# Patient Record
Sex: Female | Born: 1985
Health system: Southern US, Community
[De-identification: ages and names within clinical notes are randomized; demographics above are authoritative.]

## PROBLEM LIST (undated history)

## (undated) DIAGNOSIS — I10 Essential (primary) hypertension: Secondary | ICD-10-CM

## (undated) DIAGNOSIS — T783XXA Angioneurotic edema, initial encounter: Secondary | ICD-10-CM

## (undated) HISTORY — PX: OTHER SURGICAL HISTORY: SHX169

## (undated) HISTORY — DX: Essential (primary) hypertension: I10

## (undated) HISTORY — PX: DILATION AND CURETTAGE OF UTERUS: SHX78

---

## 2012-07-21 DIAGNOSIS — T783XXA Angioneurotic edema, initial encounter: Secondary | ICD-10-CM | POA: Insufficient documentation

## 2018-03-11 HISTORY — PX: HYSTEROSCOPY: SHX211

## 2018-06-09 DIAGNOSIS — I10 Essential (primary) hypertension: Secondary | ICD-10-CM | POA: Insufficient documentation

## 2019-07-20 ENCOUNTER — Ambulatory Visit (HOSPITAL_COMMUNITY)
Admission: EM | Admit: 2019-07-20 | Discharge: 2019-07-20 | Disposition: A | Discharge status: Home / Self Care | Payer: 59 | Attending: Family Medicine | Admitting: Family Medicine

## 2019-07-20 ENCOUNTER — Encounter (HOSPITAL_COMMUNITY): Payer: Self-pay | Admitting: Emergency Medicine

## 2019-07-20 ENCOUNTER — Other Ambulatory Visit: Payer: Self-pay

## 2019-07-20 DIAGNOSIS — J029 Acute pharyngitis, unspecified: Secondary | ICD-10-CM | POA: Diagnosis not present

## 2019-07-20 DIAGNOSIS — R0602 Shortness of breath: Secondary | ICD-10-CM | POA: Insufficient documentation

## 2019-07-20 DIAGNOSIS — J069 Acute upper respiratory infection, unspecified: Secondary | ICD-10-CM

## 2019-07-20 DIAGNOSIS — Z20828 Contact with and (suspected) exposure to other viral communicable diseases: Secondary | ICD-10-CM | POA: Diagnosis not present

## 2019-07-20 DIAGNOSIS — Z1159 Encounter for screening for other viral diseases: Secondary | ICD-10-CM

## 2019-07-20 DIAGNOSIS — R630 Anorexia: Secondary | ICD-10-CM | POA: Diagnosis not present

## 2019-07-20 HISTORY — DX: Angioneurotic edema, initial encounter: T78.3XXA

## 2019-07-20 NOTE — ED Notes (Signed)
Nasal swab in lab 

## 2019-07-20 NOTE — ED Provider Notes (Signed)
[place]    CSN: [id number] Arrival date & time: 07/20/19  7829      History   Chief Complaint Chief Complaint  Patient presents with  . URI    HPI Melanie Hunt is a 33 y.o. female.   Melanie Hunt presents with complaints of nasal drainage, sore throat, cough. Started on 10/23. Vomited once a few days ago. Has now felt weak and fatigue for a few days now as well. Loss of appetite taste and smell. No further nausea. Mild shortness of breath  With activity. Cough is productive of phlegm which is yellow. No known fevers, but has felt warm. No rash. No ear pain. No known ill contacts. Works in a [employer]. Has taken alka seltzer, advil cold and sinus and mucinex today for symptoms. They seem to briefly help. Has been using afrin as well.  No history of asthma. No chest pain . Without contributing medical history.      ROS per HPI, negative if not otherwise mentioned.        Past Medical History:  Diagnosis Date  . Idiopathic angioedema     There are no active problems to display for this patient.   History reviewed. No pertinent surgical history.  OB History   No obstetric history on file.      Home Medications    Prior to Admission medications   Medication Sig Start Date End Date Taking? Authorizing Provider  guaiFENesin (MUCINEX) 600 MG 12 hr tablet Take by mouth 2 (two) times daily.   Yes [provider]  Phenyleph-CPM-DM-APAP (ALKA-SELTZER PLUS COLD & FLU PO) Take by mouth.   Yes [provider]  Pseudoephedrine-Ibuprofen (ADVIL COLD/SINUS PO) Take by mouth.   Yes [provider]    Family History Family History  Problem Relation Age of Onset  . Hypertension Mother     Social History Social History   Tobacco Use  . Smoking status: Never Smoker  Substance Use Topics  . Alcohol use: Yes  . Drug use: Never     Allergies   Patient has no known allergies.   Review of Systems Review of Systems    Physical Exam Triage Vital Signs ED Triage Vitals  Enc Vitals Group     BP 07/20/19 0914 (!) 145/101     Pulse Rate 07/20/19 0914 100     Resp 07/20/19 0914 18     Temp 07/20/19 0914 98.9 F (37.2 C)     Temp Source 07/20/19 0914 Oral     SpO2 07/20/19 0914 100 %     Weight --      Height --      Head Circumference --      Peak Flow --      Pain Score 07/20/19 0908 5     Pain Loc --      Pain Edu? --      Excl. in [place]? --    No data found.  Updated Vital Signs BP (!) 145/101 (BP Location: Right Arm) Comment (BP Location): large cuff  Pulse 100   Temp 98.9 F (37.2 C) (Oral)   Resp 18   SpO2 100%    Physical Exam Constitutional:      General: She is not in acute distress.    Appearance: She is well-developed.  HENT:     Nose: Congestion and rhinorrhea present.  Cardiovascular:     Rate and Rhythm: Normal rate.  Pulmonary:     Effort: Pulmonary effort is  normal.  Skin:    General: Skin is warm and dry.  Neurological:     Mental Status: She is alert and oriented to person, place, and time.      UC Treatments / Results  Labs (all labs ordered are listed, but only abnormal results are displayed) Labs Reviewed  NOVEL CORONAVIRUS, NAA (HOSP ORDER, SEND-OUT TO REF LAB; TAT 18-24 HRS)    EKG   Radiology No results found.  Procedures Procedures (including critical care time)  Medications Ordered in UC Medications - No data to display  Initial Impression / Assessment and Plan / UC Course  I have reviewed the triage vital signs and the nursing notes.  Pertinent labs & imaging results that were available during my care of the patient were reviewed by me and considered in my medical decision making (see chart for details).     Non toxic. Afebrile. No increased work of breathing. No hypoxia. History and physical consistent with viral illness.  covid testing pending. Isolation recommended. Supportive cares recommended. Return precautions provided. Patient  verbalized understanding and agreeable to plan.   Final Clinical Impressions(s) / UC Diagnoses   Final diagnoses:  Acute upper respiratory infection     Discharge Instructions     Push fluids to ensure adequate hydration and keep secretions thin.  Tylenol and/or ibuprofen as needed for pain or fevers.  Over the counter medications as needed for symptoms, as you have been doing. I would discontinue afrin at this point. You can use plain nasal saline to help promote drainage and moisturize the nose, as well as a daily flonase or nasacort.  Self isolate until covid results are back and negative.  Will notify you of any positive findings. You may monitor your results on your MyChart online as well.    If symptoms worsen or do not improve in the next week to return to be seen or to follow up with your PCP.      ED Prescriptions    None     PDMP not reviewed this encounter.   Georgetta Haber, NP 07/20/19 [phone]

## 2019-07-20 NOTE — Discharge Instructions (Signed)
Push fluids to ensure adequate hydration and keep secretions thin.  Tylenol and/or ibuprofen as needed for pain or fevers.  Over the counter medications as needed for symptoms, as you have been doing. I would discontinue afrin at this point. You can use plain nasal saline to help promote drainage and moisturize the nose, as well as a daily flonase or nasacort.  Self isolate until covid results are back and negative.  Will notify you of any positive findings. You may monitor your results on your MyChart online as well.    If symptoms worsen or do not improve in the next week to return to be seen or to follow up with your PCP.

## 2019-07-20 NOTE — ED Triage Notes (Signed)
Started feeling bad on Friday 07/15/2019 Patient has had sore throat , cough, congestion, runny nose, fatigue and weakness, and lost of appetite-taste and smell

## 2019-07-23 LAB — NOVEL CORONAVIRUS, NAA (HOSP ORDER, SEND-OUT TO REF LAB; TAT 18-24 HRS): SARS-CoV-2, NAA: NOT DETECTED

## 2019-09-06 ENCOUNTER — Ambulatory Visit (HOSPITAL_COMMUNITY)
Admission: EM | Admit: 2019-09-06 | Discharge: 2019-09-06 | Disposition: A | Discharge status: Home / Self Care | Payer: 59

## 2019-09-06 ENCOUNTER — Other Ambulatory Visit: Payer: Self-pay

## 2019-09-06 NOTE — ED Notes (Signed)
Patient LWBS prior to registration for care today.

## 2019-11-04 ENCOUNTER — Encounter (HOSPITAL_COMMUNITY): Payer: Self-pay | Admitting: Internal Medicine

## 2019-11-04 ENCOUNTER — Encounter (HOSPITAL_COMMUNITY): Payer: Self-pay

## 2019-11-04 ENCOUNTER — Other Ambulatory Visit: Payer: Self-pay

## 2019-11-04 ENCOUNTER — Emergency Department (HOSPITAL_COMMUNITY): Payer: HRSA Program

## 2019-11-04 ENCOUNTER — Ambulatory Visit (INDEPENDENT_AMBULATORY_CARE_PROVIDER_SITE_OTHER): Payer: HRSA Program

## 2019-11-04 ENCOUNTER — Ambulatory Visit (HOSPITAL_COMMUNITY)
Admission: EM | Admit: 2019-11-04 | Discharge: 2019-11-04 | Payer: HRSA Program | Attending: Family Medicine | Admitting: Family Medicine

## 2019-11-04 ENCOUNTER — Inpatient Hospital Stay (HOSPITAL_COMMUNITY)
Admission: EM | Admit: 2019-11-04 | Discharge: 2019-11-09 | DRG: 177 | Disposition: A | Discharge status: Home / Self Care | Payer: HRSA Program | Attending: Internal Medicine | Admitting: Internal Medicine

## 2019-11-04 DIAGNOSIS — J189 Pneumonia, unspecified organism: Secondary | ICD-10-CM

## 2019-11-04 DIAGNOSIS — Z6836 Body mass index (BMI) 36.0-36.9, adult: Secondary | ICD-10-CM | POA: Diagnosis not present

## 2019-11-04 DIAGNOSIS — D649 Anemia, unspecified: Secondary | ICD-10-CM

## 2019-11-04 DIAGNOSIS — U071 COVID-19: Secondary | ICD-10-CM

## 2019-11-04 DIAGNOSIS — N921 Excessive and frequent menstruation with irregular cycle: Secondary | ICD-10-CM | POA: Diagnosis present

## 2019-11-04 DIAGNOSIS — J1282 Pneumonia due to coronavirus disease 2019: Secondary | ICD-10-CM | POA: Insufficient documentation

## 2019-11-04 DIAGNOSIS — J9601 Acute respiratory failure with hypoxia: Secondary | ICD-10-CM

## 2019-11-04 DIAGNOSIS — E876 Hypokalemia: Secondary | ICD-10-CM | POA: Diagnosis present

## 2019-11-04 DIAGNOSIS — Z8249 Family history of ischemic heart disease and other diseases of the circulatory system: Secondary | ICD-10-CM

## 2019-11-04 DIAGNOSIS — E669 Obesity, unspecified: Secondary | ICD-10-CM | POA: Diagnosis not present

## 2019-11-04 DIAGNOSIS — R0602 Shortness of breath: Secondary | ICD-10-CM | POA: Diagnosis present

## 2019-11-04 DIAGNOSIS — R05 Cough: Secondary | ICD-10-CM | POA: Diagnosis present

## 2019-11-04 HISTORY — DX: Acute respiratory failure with hypoxia: J96.01

## 2019-11-04 HISTORY — DX: COVID-19: U07.1

## 2019-11-04 LAB — TRIGLYCERIDES: Triglycerides: 160 mg/dL — ABNORMAL HIGH (ref <150)

## 2019-11-04 LAB — RESPIRATORY PANEL BY RT PCR (FLU A&B, COVID)
Influenza A by PCR: NEGATIVE
Influenza B by PCR: NEGATIVE
SARS Coronavirus 2 by RT PCR: POSITIVE — AB

## 2019-11-04 LAB — CBC WITH DIFFERENTIAL/PLATELET
Abs Immature Granulocytes: 0.01 10*3/uL (ref 0.00–0.07)
Abs Immature Granulocytes: 0.01 10*3/uL (ref 0.00–0.07)
Basophils Absolute: 0 10*3/uL (ref 0.0–0.1)
Basophils Absolute: 0 10*3/uL (ref 0.0–0.1)
Basophils Relative: 0 %
Basophils Relative: 0 %
Eosinophils Absolute: 0 10*3/uL (ref 0.0–0.5)
Eosinophils Absolute: 0 10*3/uL (ref 0.0–0.5)
Eosinophils Relative: 0 %
Eosinophils Relative: 0 %
HCT: 36.3 % (ref 36.0–46.0)
HCT: 36.7 % (ref 36.0–46.0)
Hemoglobin: 12 g/dL (ref 12.0–15.0)
Hemoglobin: 12 g/dL (ref 12.0–15.0)
Immature Granulocytes: 0 %
Immature Granulocytes: 0 %
Lymphocytes Relative: 21 %
Lymphocytes Relative: 25 %
Lymphs Abs: 1 10*3/uL (ref 0.7–4.0)
Lymphs Abs: 1.1 10*3/uL (ref 0.7–4.0)
MCH: 28.2 pg (ref 26.0–34.0)
MCH: 27.8 pg (ref 26.0–34.0)
MCHC: 33.1 g/dL (ref 30.0–36.0)
MCHC: 32.7 g/dL (ref 30.0–36.0)
MCV: 85.4 fL (ref 80.0–100.0)
MCV: 85.2 fL (ref 80.0–100.0)
Monocytes Absolute: 0.1 10*3/uL (ref 0.1–1.0)
Monocytes Absolute: 0.1 10*3/uL (ref 0.1–1.0)
Monocytes Relative: 2 %
Monocytes Relative: 2 %
Neutro Abs: 3.5 10*3/uL (ref 1.7–7.7)
Neutro Abs: 3.1 10*3/uL (ref 1.7–7.7)
Neutrophils Relative %: 77 %
Neutrophils Relative %: 73 %
Platelets: 199 10*3/uL (ref 150–400)
Platelets: 200 10*3/uL (ref 150–400)
RBC: 4.25 MIL/uL (ref 3.87–5.11)
RBC: 4.31 MIL/uL (ref 3.87–5.11)
RDW: 13.3 % (ref 11.5–15.5)
RDW: 13.3 % (ref 11.5–15.5)
WBC: 4.6 10*3/uL (ref 4.0–10.5)
WBC: 4.3 10*3/uL (ref 4.0–10.5)
nRBC: 0 % (ref 0.0–0.2)
nRBC: 0 % (ref 0.0–0.2)

## 2019-11-04 LAB — COMPREHENSIVE METABOLIC PANEL
ALT: 31 U/L (ref 0–44)
ALT: 31 U/L (ref 0–44)
AST: 40 U/L (ref 15–41)
AST: 41 U/L (ref 15–41)
Albumin: 3.3 g/dL — ABNORMAL LOW (ref 3.5–5.0)
Albumin: 3.3 g/dL — ABNORMAL LOW (ref 3.5–5.0)
Alkaline Phosphatase: 79 U/L (ref 38–126)
Alkaline Phosphatase: 76 U/L (ref 38–126)
Anion gap: 12 (ref 5–15)
Anion gap: 13 (ref 5–15)
BUN: <5 mg/dL — ABNORMAL LOW (ref 6–20)
BUN: 5 mg/dL — ABNORMAL LOW (ref 6–20)
CO2: 26 mmol/L (ref 22–32)
CO2: 27 mmol/L (ref 22–32)
Calcium: 7.9 mg/dL — ABNORMAL LOW (ref 8.9–10.3)
Calcium: 7.8 mg/dL — ABNORMAL LOW (ref 8.9–10.3)
Chloride: 102 mmol/L (ref 98–111)
Chloride: 100 mmol/L (ref 98–111)
Creatinine, Ser: 1.05 mg/dL — ABNORMAL HIGH (ref 0.44–1.00)
Creatinine, Ser: 1.07 mg/dL — ABNORMAL HIGH (ref 0.44–1.00)
GFR calc Af Amer: >60 mL/min (ref >60)
GFR calc Af Amer: >60 mL/min (ref >60)
GFR calc non Af Amer: >60 mL/min (ref >60)
GFR calc non Af Amer: >60 mL/min (ref >60)
Glucose, Bld: 108 mg/dL — ABNORMAL HIGH (ref 70–99)
Glucose, Bld: 101 mg/dL — ABNORMAL HIGH (ref 70–99)
Potassium: 3.1 mmol/L — ABNORMAL LOW (ref 3.5–5.1)
Potassium: 3.1 mmol/L — ABNORMAL LOW (ref 3.5–5.1)
Sodium: 140 mmol/L (ref 135–145)
Sodium: 140 mmol/L (ref 135–145)
Total Bilirubin: 0.7 mg/dL (ref 0.3–1.2)
Total Bilirubin: 0.8 mg/dL (ref 0.3–1.2)
Total Protein: 6.8 g/dL (ref 6.5–8.1)
Total Protein: 6.8 g/dL (ref 6.5–8.1)

## 2019-11-04 LAB — POC SARS CORONAVIRUS 2 AG: SARS Coronavirus 2 Ag: NEGATIVE

## 2019-11-04 LAB — SARS CORONAVIRUS 2 (TAT 6-24 HRS): SARS Coronavirus 2: POSITIVE — AB

## 2019-11-04 LAB — D-DIMER, QUANTITATIVE: D-Dimer, Quant: 0.64 ug/mL-FEU — ABNORMAL HIGH (ref 0.00–0.50)

## 2019-11-04 LAB — FERRITIN: Ferritin: 404 ng/mL — ABNORMAL HIGH (ref 11–307)

## 2019-11-04 LAB — I-STAT BETA HCG BLOOD, ED (MC, WL, AP ONLY): I-stat hCG, quantitative: <5 m[IU]/mL (ref <5)

## 2019-11-04 LAB — C-REACTIVE PROTEIN: CRP: 15.9 mg/dL — ABNORMAL HIGH (ref <1.0)

## 2019-11-04 LAB — LACTATE DEHYDROGENASE: LDH: 395 U/L — ABNORMAL HIGH (ref 98–192)

## 2019-11-04 LAB — LACTIC ACID, PLASMA
Lactic Acid, Venous: 0.9 mmol/L (ref 0.5–1.9)
Lactic Acid, Venous: 1.1 mmol/L (ref 0.5–1.9)

## 2019-11-04 LAB — FIBRINOGEN: Fibrinogen: 571 mg/dL — ABNORMAL HIGH (ref 210–475)

## 2019-11-04 LAB — PROCALCITONIN: Procalcitonin: 0.62 ng/mL

## 2019-11-04 LAB — HCG, QUANTITATIVE, PREGNANCY: hCG, Beta Chain, Quant, S: <1 m[IU]/mL (ref <5)

## 2019-11-04 LAB — ABO/RH: ABO/RH(D): O POS

## 2019-11-04 LAB — POC SARS CORONAVIRUS 2 AG -  ED: SARS Coronavirus 2 Ag: NEGATIVE

## 2019-11-04 MED ORDER — ASCORBIC ACID 500 MG PO TABS
500.0000 mg | ORAL_TABLET | Freq: Every day | ORAL | Status: DC
  Administered 2019-11-04 – 2019-11-09 (×6): 500 mg via ORAL
  Filled 2019-11-04 (×6): qty 1

## 2019-11-04 MED ORDER — DEXAMETHASONE 6 MG PO TABS
6.0000 mg | ORAL_TABLET | ORAL | Status: DC
  Administered 2019-11-04 – 2019-11-09 (×5): 6 mg via ORAL
  Filled 2019-11-04 (×5): qty 1

## 2019-11-04 MED ORDER — ACETAMINOPHEN 500 MG PO TABS
1000.0000 mg | ORAL_TABLET | Freq: Once | ORAL | Status: AC
  Administered 2019-11-04: 1000 mg via ORAL
  Filled 2019-11-04: qty 2

## 2019-11-04 MED ORDER — ENOXAPARIN SODIUM 40 MG/0.4ML ~~LOC~~ SOLN
40.0000 mg | SUBCUTANEOUS | Status: DC
  Administered 2019-11-04: 16:00:00 40 mg via SUBCUTANEOUS
  Filled 2019-11-04: qty 0.4

## 2019-11-04 MED ORDER — GUAIFENESIN ER 600 MG PO TB12
1200.0000 mg | ORAL_TABLET | Freq: Two times a day (BID) | ORAL | Status: DC
  Administered 2019-11-04 – 2019-11-09 (×11): 1200 mg via ORAL
  Filled 2019-11-04 (×11): qty 2

## 2019-11-04 MED ORDER — ACETAMINOPHEN 325 MG PO TABS
975.0000 mg | ORAL_TABLET | Freq: Once | ORAL | Status: AC
  Administered 2019-11-04: 975 mg via ORAL

## 2019-11-04 MED ORDER — SODIUM CHLORIDE 0.9 % IV SOLN
100.0000 mg | Freq: Every day | INTRAVENOUS | Status: AC
  Administered 2019-11-05 – 2019-11-08 (×4): 100 mg via INTRAVENOUS
  Filled 2019-11-04 (×4): qty 20

## 2019-11-04 MED ORDER — POTASSIUM CHLORIDE CRYS ER 20 MEQ PO TBCR
40.0000 meq | EXTENDED_RELEASE_TABLET | Freq: Once | ORAL | Status: AC
  Administered 2019-11-04: 16:00:00 40 meq via ORAL
  Filled 2019-11-04: qty 2

## 2019-11-04 MED ORDER — SODIUM CHLORIDE 0.9 % IV SOLN
200.0000 mg | Freq: Once | INTRAVENOUS | Status: AC
  Administered 2019-11-04: 16:00:00 200 mg via INTRAVENOUS
  Filled 2019-11-04: qty 40

## 2019-11-04 MED ORDER — TOCILIZUMAB 400 MG/20ML IV SOLN
800.0000 mg | Freq: Once | INTRAVENOUS | Status: AC
  Administered 2019-11-04: 800 mg via INTRAVENOUS
  Filled 2019-11-04: qty 40

## 2019-11-04 MED ORDER — ENOXAPARIN SODIUM 60 MG/0.6ML ~~LOC~~ SOLN
50.0000 mg | SUBCUTANEOUS | Status: DC
  Administered 2019-11-05 – 2019-11-08 (×4): 50 mg via SUBCUTANEOUS
  Filled 2019-11-04 (×4): qty 0.6

## 2019-11-04 MED ORDER — AZITHROMYCIN 250 MG PO TABS
500.0000 mg | ORAL_TABLET | Freq: Once | ORAL | Status: AC
  Administered 2019-11-04: 12:00:00 500 mg via ORAL
  Filled 2019-11-04: qty 2

## 2019-11-04 MED ORDER — ZINC SULFATE 220 (50 ZN) MG PO CAPS
220.0000 mg | ORAL_CAPSULE | Freq: Every day | ORAL | Status: DC
  Administered 2019-11-04 – 2019-11-09 (×6): 220 mg via ORAL
  Filled 2019-11-04 (×6): qty 1

## 2019-11-04 MED ORDER — SENNOSIDES-DOCUSATE SODIUM 8.6-50 MG PO TABS
1.0000 | ORAL_TABLET | Freq: Every evening | ORAL | Status: DC | PRN
  Filled 2019-11-04: qty 1

## 2019-11-04 MED ORDER — DEXAMETHASONE SODIUM PHOSPHATE 10 MG/ML IJ SOLN
10.0000 mg | Freq: Once | INTRAMUSCULAR | Status: AC
  Administered 2019-11-04: 15:00:00 10 mg via INTRAVENOUS
  Filled 2019-11-04: qty 1

## 2019-11-04 MED ORDER — INFLUENZA VAC SPLIT QUAD 0.5 ML IM SUSY
0.5000 mL | PREFILLED_SYRINGE | INTRAMUSCULAR | Status: DC
  Filled 2019-11-04: qty 0.5

## 2019-11-04 MED ORDER — ALBUTEROL SULFATE HFA 108 (90 BASE) MCG/ACT IN AERS
2.0000 | INHALATION_SPRAY | Freq: Once | RESPIRATORY_TRACT | Status: AC
  Administered 2019-11-04: 15:00:00 2 via RESPIRATORY_TRACT
  Filled 2019-11-04: qty 6.7

## 2019-11-04 MED ORDER — ALBUTEROL SULFATE HFA 108 (90 BASE) MCG/ACT IN AERS
2.0000 | INHALATION_SPRAY | Freq: Four times a day (QID) | RESPIRATORY_TRACT | Status: DC
  Administered 2019-11-04 – 2019-11-09 (×19): 2 via RESPIRATORY_TRACT
  Filled 2019-11-04: qty 6.7

## 2019-11-04 MED ORDER — ENOXAPARIN SODIUM 60 MG/0.6ML ~~LOC~~ SOLN: 50.0000 mg | SUBCUTANEOUS | Status: DC

## 2019-11-04 MED ORDER — POTASSIUM CHLORIDE CRYS ER 20 MEQ PO TBCR
40.0000 meq | EXTENDED_RELEASE_TABLET | Freq: Every day | ORAL | Status: DC
  Administered 2019-11-04: 19:00:00 40 meq via ORAL
  Filled 2019-11-04: qty 2

## 2019-11-04 MED ORDER — ACETAMINOPHEN 325 MG PO TABS
ORAL_TABLET | ORAL | Status: AC
  Filled 2019-11-04: qty 3

## 2019-11-04 MED ORDER — ACETAMINOPHEN 500 MG PO TABS: 1000.0000 mg | ORAL_TABLET | Freq: Four times a day (QID) | ORAL | Status: DC | PRN

## 2019-11-04 MED ORDER — ALBUMIN HUMAN 25 % IV SOLN
12.5000 g | Freq: Four times a day (QID) | INTRAVENOUS | Status: DC
  Filled 2019-11-04 (×3): qty 50

## 2019-11-04 MED ORDER — SODIUM CHLORIDE 0.9 % IV SOLN
1.0000 g | Freq: Once | INTRAVENOUS | Status: AC
  Administered 2019-11-04: 12:00:00 1 g via INTRAVENOUS
  Filled 2019-11-04: qty 10

## 2019-11-04 NOTE — ED Notes (Signed)
[facility] notified of transport.  

## 2019-11-04 NOTE — ED Triage Notes (Signed)
Pt is here with SOB that started Wednesday & her cough developed 10/28/2019. She has taken alka seltzer,mucinex  and Advil to relieve discomfort.

## 2019-11-04 NOTE — ED Notes (Signed)
[facility] is here to transport pt to [facility].

## 2019-11-04 NOTE — H&P (Signed)
History and Physical    Melanie Hunt PZW:[id number] DOB: 01/14/1986 DOA: 11/04/2019  PCP: Patient, No Pcp Per    Patient coming from: Home  I have personally briefly reviewed patient's old medical records in [place]  Chief Complaint: SOB  HPI: Melanie Hunt is a 34 y.o. female with no significant past medical history presented with increasing cough and short of breath for one week. Along with subjective fever and whole body aching and fatigue.  Symptoms started worsening on two days ago with increasing shortness of breath.  Coughing has been dry, and Advil and Tylenol alternative did little for her symptoms.  No sore throat, wheezing or chest pain.  No known COVID contact.  ED Course: O2 saturation 87% on room air, improved to 95% on 2 L.  Covid test came back positive  Review of Systems: As per HPI otherwise 10 point review of systems negative.    Past Medical History:  Diagnosis Date  . Idiopathic angioedema     No past surgical history on file.   reports that she has never smoked. She has never used smokeless tobacco. She reports current alcohol use. She reports that she does not use drugs.  Allergies  Allergen Reactions  . No Known Allergies     Family History  Problem Relation Age of Onset  . Hypertension Mother      Prior to Admission medications   Medication Sig Start Date End Date Taking? Authorizing Provider  acetaminophen (TYLENOL) 500 MG tablet Take 1,000 mg by mouth every 6 (six) hours as needed for mild pain.   Yes [provider]  guaiFENesin (MUCINEX) 600 MG 12 hr tablet Take by mouth 2 (two) times daily.   Yes [provider]  Phenyleph-CPM-DM-APAP (ALKA-SELTZER PLUS COLD & FLU PO) Take by mouth.   Yes [provider]    Physical Exam: Vitals:   11/04/19 1330 11/04/19 1345 11/04/19 1400 11/04/19 1415  BP: 138/87 (!) 128/105 132/84 129/73  Pulse: 99 96 96 93  Resp: (!) 29 (!) 35 (!) 33 (!) 27  Temp:      TempSrc:       SpO2: 97% 96% 95% 97%    Constitutional: NAD, calm, comfortable Vitals:   11/04/19 1330 11/04/19 1345 11/04/19 1400 11/04/19 1415  BP: 138/87 (!) 128/105 132/84 129/73  Pulse: 99 96 96 93  Resp: (!) 29 (!) 35 (!) 33 (!) 27  Temp:      TempSrc:      SpO2: 97% 96% 95% 97%   Eyes: PERRL, lids and conjunctivae normal ENMT: Mucous membranes are dry. Posterior pharynx clear of any exudate or lesions.Normal dentition.  Neck: normal, supple, no masses, no thyromegaly Respiratory: Diminished breathing sound, tachypneic, no crackles with wheezing. Normal respiratory effort. No accessory muscle use.  Cardiovascular: Regular rate and rhythm, no murmurs / rubs / gallops. No extremity edema. 2+ pedal pulses. No carotid bruits.  Abdomen: no tenderness, no masses palpated. No hepatosplenomegaly. Bowel sounds positive.  Musculoskeletal: no clubbing / cyanosis. No joint deformity upper and lower extremities. Good ROM, no contractures. Normal muscle tone.  Skin: no rashes, lesions, ulcers. No induration Neurologic: CN 2-12 grossly intact. Sensation intact, DTR normal. Strength 5/5 in all 4.  Psychiatric: Normal judgment and insight. Alert and oriented x 3. Normal mood.     Labs on Admission: I have personally reviewed following labs and imaging studies  CBC: Recent Labs  Lab 11/04/19 1038 11/04/19 1056  WBC 4.6 4.3  NEUTROABS 3.5  3.1  HGB 12.0 12.0  HCT 36.3 36.7  MCV 85.4 85.2  PLT 199 200   Basic Metabolic Panel: Recent Labs  Lab 11/04/19 1038 11/04/19 1056  NA 140 140  K 3.1* 3.1*  CL 102 100  CO2 26 27  GLUCOSE 108* 101*  BUN <5* 5*  CREATININE 1.05* 1.07*  CALCIUM 7.9* 7.8*   GFR: CrCl cannot be calculated (Unknown ideal weight.). Liver Function Tests: Recent Labs  Lab 11/04/19 1038 11/04/19 1056  AST 40 41  ALT 31 31  ALKPHOS 79 76  BILITOT 0.7 0.8  PROT 6.8 6.8  ALBUMIN 3.3* 3.3*   No results for input(s): LIPASE, AMYLASE in the last 168 hours. No results  for input(s): AMMONIA in the last 168 hours. Coagulation Profile: No results for input(s): INR, PROTIME in the last 168 hours. Cardiac Enzymes: No results for input(s): CKTOTAL, CKMB, CKMBINDEX, TROPONINI in the last 168 hours. BNP (last 3 results) No results for input(s): PROBNP in the last 8760 hours. HbA1C: No results for input(s): HGBA1C in the last 72 hours. CBG: No results for input(s): GLUCAP in the last 168 hours. Lipid Profile: Recent Labs    11/04/19 1056  TRIG 160*   Thyroid Function Tests: No results for input(s): TSH, T4TOTAL, FREET4, T3FREE, THYROIDAB in the last 72 hours. Anemia Panel: Recent Labs    11/04/19 1056  FERRITIN 404*   Urine analysis: No results found for: COLORURINE, APPEARANCEUR, LABSPEC, PHURINE, GLUCOSEU, HGBUR, BILIRUBINUR, KETONESUR, PROTEINUR, UROBILINOGEN, NITRITE, LEUKOCYTESUR  Radiological Exams on Admission: DG Chest 2 View  Result Date: 11/04/2019 CLINICAL DATA:  Cough and shortness of breath with hypoxia EXAM: CHEST - 2 VIEW COMPARISON:  None. FINDINGS: Cardiac shadow is at the upper limits of normal in size. Bilateral opacities are identified consistent with multifocal pneumonia. No sizable effusion is noted. No bony abnormality is seen. IMPRESSION: Multifocal pneumonia.  Correlate with COVID-19 test Electronically Signed   By: Alcide Clever M.D.   On: 11/04/2019 09:44   DG Chest Portable 1 View  Result Date: 11/04/2019 CLINICAL DATA:  Cough and shortness of breath.  Fever. EXAM: PORTABLE CHEST 1 VIEW COMPARISON:  November 04, 2019 study obtained earlier in the day FINDINGS: There is patchy airspace opacity bilaterally with small left pleural effusion. Heart is upper normal in size with pulmonary vascularity normal. No adenopathy. No bone lesions. IMPRESSION: Multifocal pneumonia with small left pleural effusion, stable. No new opacity compared to earlier in the day. Stable cardiac silhouette. No adenopathy evident. Electronically Signed    By: Bretta Bang III M.D.   On: 11/04/2019 11:04    EKG: Independently reviewed.  Sinus tachycardia  Assessment/Plan Active Problems:   COVID-19 virus infection   COVID-19  Acute hypoxic respite failure secondary to COVID-19 pneumonia Remdesivir and steroid Albumin infusion x3 days Admitted to [facility], expect to be discharged in 1 to 2 days once oxygenation level improved. Breathing meds and other supportive medications..  Hypokalemia Replace and recheck  Morbid obesity Diet and calorie control   DVT prophylaxis: Lovenox Code Status: Full code Family Communication: None at bedside Disposition Plan: Hypoxia improved patient can be discharged likely in 1 to 2 days and complete remdesivir infusion outpatient infusion center. Consults called: None Admission status: Admitted to [facility] telemetry   Emeline General MD [facility] Pager [phone]    11/04/2019, 2:53 PM

## 2019-11-04 NOTE — Progress Notes (Addendum)
Patient seen and examined and reviewed HPI per Dr. Chipper Herb earlier from this afternoon   34 year old homosexual female known history metromenorrhagia failure of Depo-Provera followed at [facility], prior angioedema, obesity Admitted from [facility] emergency room with coronavirus symptoms  Potassium 3.1 BUN/creatinine stable LFTs normal CRP elevated 15.9 procalcitonin 0.6 lactic acid 1.1CXR shows multifocal pneumonia  Given somnolent appearance, fever 103 risk benefit discussion e: Actemra discussed no underlying conditions seem to preclude its use  Will re-val in am and update family  Pleas Koch, MD [facility] 5:27 PM

## 2019-11-04 NOTE — ED Notes (Signed)
Patient is being discharged from the Urgent Care Center and sent to the Emergency Department via wheelchair by staff. Per provider Dahlia Byes, patient is stable but in need of higher level of care due to unstable vitals & pneumonia diagnosis. Patient is aware and verbalizes understanding of plan of care.   Vitals:   11/04/19 0922 11/04/19 0936  BP:  (!) 142/82  Pulse:  (!) 116  Resp:  (!) 27  Temp:  (!) 102.9 F (39.4 C)  SpO2: (!) 85% 90%

## 2019-11-04 NOTE — ED Provider Notes (Signed)
[place]    CSN: [id number] Arrival date & time: 11/04/19  0626      History   Chief Complaint Chief Complaint  Patient presents with  . Shortness of Breath  . Cough    HPI Melanie Hunt is a 34 y.o. female.   Patient is a 34 year old female that  presents today with approximately 1 week of worsening productive cough, shortness of breath, fever.  Symptoms started worsening on Wednesday with more shortness of breath.  She has been taking Advil for her symptoms.  She has had some fatigue.  No known medical issues that she knows of.  No sore throat, ear pain.  No recent sick contacts or recent traveling.  No history of DVT or PE.  No recent traveling.  No lower extremity edema or history of heart failure.  ROS per HPI       Past Medical History:  Diagnosis Date  . Idiopathic angioedema     There are no problems to display for this patient.   History reviewed. No pertinent surgical history.  OB History   No obstetric history on file.      Home Medications    Prior to Admission medications   Medication Sig Start Date End Date Taking? Authorizing Provider  naproxen (NAPROSYN) 500 MG tablet naproxen 500 mg tablet  TAKE 1 TABLET BY MOUTH TWICE A DAY AS NEEDED 11/25/17  Yes [provider]  Acetaminophen-Codeine 300-30 MG tablet acetaminophen 300 mg-codeine 30 mg tablet  TAKE 1 TO 2 TABLETS EVERY 6 HOURS AS NEEDED FOR PAIN    [provider]  guaiFENesin (MUCINEX) 600 MG 12 hr tablet Take by mouth 2 (two) times daily.    [provider]  Multiple Vitamins-Calcium (DAILY COMBO MULTIVITS/CALCIUM PO) Take by mouth.    [provider]  Phenyleph-CPM-DM-APAP (ALKA-SELTZER PLUS COLD & FLU PO) Take by mouth.    [provider]  Pseudoephedrine-Ibuprofen (ADVIL COLD/SINUS PO) Take by mouth.    [provider]    Family History Family History  Problem Relation Age of Onset  . Hypertension Mother     Social  History Social History   Tobacco Use  . Smoking status: Never Smoker  . Smokeless tobacco: Never Used  Substance Use Topics  . Alcohol use: Yes  . Drug use: Never     Allergies   No known allergies   Review of Systems Review of Systems   Physical Exam Triage Vital Signs ED Triage Vitals  Enc Vitals Group     BP 11/04/19 0936 (!) 142/82     Pulse Rate 11/04/19 0936 (!) 116     Resp 11/04/19 0936 (!) 27     Temp 11/04/19 0936 (!) 102.9 F (39.4 C)     Temp Source 11/04/19 0936 Oral     SpO2 11/04/19 0936 90 %     Weight 11/04/19 0930 231 lb 6.4 oz (105 kg)     Height --      Head Circumference --      Peak Flow --      Pain Score 11/04/19 0929 5     Pain Loc --      Pain Edu? --      Excl. in [place]? --    No data found.  Updated Vital Signs BP (!) 142/82 (BP Location: Left Arm)   Pulse (!) 116   Temp (!) 102.9 F (39.4 C) (Oral)   Resp (!) 27 Comment: Notified provider  [facility]  231 lb 6.4 oz (105 kg)   SpO2 90%   Visual Acuity Right Eye Distance:   Left Eye Distance:   Bilateral Distance:    Right Eye Near:   Left Eye Near:    Bilateral Near:     Physical Exam Vitals and nursing note reviewed.  Constitutional:      General: She is not in acute distress.    Appearance: She is obese. She is ill-appearing.  HENT:     Head: Normocephalic and atraumatic.  Cardiovascular:     Rate and Rhythm: Tachycardia present.  Pulmonary:     Effort: Pulmonary effort is normal. Tachypnea present.     Breath sounds: Decreased breath sounds present.  Musculoskeletal:     Cervical back: Normal range of motion.     Right lower leg: No tenderness. No edema.     Left lower leg: No tenderness. No edema.  Skin:    General: Skin is warm and dry.  Neurological:     Mental Status: She is alert.  Psychiatric:        Mood and Affect: Mood normal.      UC Treatments / Results  Labs (all labs ordered are listed, but only abnormal results are displayed) Labs Reviewed    NOVEL CORONAVIRUS, NAA (HOSP ORDER, SEND-OUT TO REF LAB; TAT 18-24 HRS)  POC SARS CORONAVIRUS 2 AG -  ED  POC SARS CORONAVIRUS 2 AG -  ED    EKG   Radiology DG Chest 2 View  Result Date: 11/04/2019 CLINICAL DATA:  Cough and shortness of breath with hypoxia EXAM: CHEST - 2 VIEW COMPARISON:  None. FINDINGS: Cardiac shadow is at the upper limits of normal in size. Bilateral opacities are identified consistent with multifocal pneumonia. No sizable effusion is noted. No bony abnormality is seen. IMPRESSION: Multifocal pneumonia.  Correlate with COVID-19 test Electronically Signed   By: Alcide Clever M.D.   On: 11/04/2019 09:44    Procedures Procedures (including critical care time)  Medications Ordered in UC Medications  acetaminophen (TYLENOL) tablet 975 mg (975 mg Oral Given 11/04/19 0939)    Initial Impression / Assessment and Plan / UC Course  I have reviewed the triage vital signs and the nursing notes.  Pertinent labs & imaging results that were available during my care of the patient were reviewed by me and considered in my medical decision making (see chart for details).     Multifocal pneumonia and hypoxia. Sending to the ER for further evaluation and management.  Patient satting around 88-90 on room air.  Oxygen increased on 2 L of oxygen X-ray shows multifocal pneumonia. Possible COVID pneumonia with negative rapid COVID here.   Final Clinical Impressions(s) / UC Diagnoses   Final diagnoses:  Multifocal pneumonia     Discharge Instructions     Sending you to the ER for further evaluation and management    ED Prescriptions    None     PDMP not reviewed this encounter.   Dahlia Byes A, NP 11/04/19 1000

## 2019-11-04 NOTE — ED Notes (Signed)
CALLED [facility] FOR PT TRANSPORT  

## 2019-11-04 NOTE — ED Provider Notes (Signed)
[facility] EMERGENCY DEPARTMENT Provider Note   CSN: [id number] Arrival date & time: 11/04/19  1007     History Chief Complaint  Patient presents with  . Shortness of Breath  . Fever    Melanie Hunt is a 34 y.o. female.  HPI   This is a 34 year old female denying chronic medical problems denies tobacco alcohol or other drugs of abuse.  She presents from urgent care after being diagnosed with multifocal pneumonia this morning.  She reports that approximately 1 week ago she developed a cold with runny nose and a sore throat and then 4 days ago started to have increasing coughing shortness of breath and then today had a fever of 103 with increasing fatigue increasing shortness of breath and a persistent cough that is getting worse.  The symptoms are gradually worsening and became severe, not associated with nausea vomiting or diarrhea, no known coronavirus exposures.  She reports that she works in a [employer].  She did have a rapid Covid antigen test at the urgent care which was negative  Past Medical History:  Diagnosis Date  . Idiopathic angioedema     There are no problems to display for this patient.   No past surgical history on file.   OB History   No obstetric history on file.     Family History  Problem Relation Age of Onset  . Hypertension Mother     Social History   Tobacco Use  . Smoking status: Never Smoker  . Smokeless tobacco: Never Used  Substance Use Topics  . Alcohol use: Yes  . Drug use: Never    Home Medications Prior to Admission medications   Medication Sig Start Date End Date Taking? Authorizing Provider  Acetaminophen-Codeine 300-30 MG tablet acetaminophen 300 mg-codeine 30 mg tablet  TAKE 1 TO 2 TABLETS EVERY 6 HOURS AS NEEDED FOR PAIN    [provider]  guaiFENesin (MUCINEX) 600 MG 12 hr tablet Take by mouth 2 (two) times daily.    [provider]  Multiple Vitamins-Calcium (DAILY COMBO  MULTIVITS/CALCIUM PO) Take by mouth.    [provider]  naproxen (NAPROSYN) 500 MG tablet naproxen 500 mg tablet  TAKE 1 TABLET BY MOUTH TWICE A DAY AS NEEDED 11/25/17   [provider]  Phenyleph-CPM-DM-APAP (ALKA-SELTZER PLUS COLD & FLU PO) Take by mouth.    [provider]  Pseudoephedrine-Ibuprofen (ADVIL COLD/SINUS PO) Take by mouth.    [provider]    Allergies    No known allergies  Review of Systems   Review of Systems  All other systems reviewed and are negative.   Physical Exam Updated Vital Signs BP (!) 152/85   Pulse (!) 107   Temp (!) 103 F (39.4 C) (Oral)   Resp (!) 26   SpO2 94%   Physical Exam Vitals and nursing note reviewed.  Constitutional:      General: She is in acute distress.     Appearance: She is well-developed.  HENT:     Head: Normocephalic and atraumatic.     Mouth/Throat:     Pharynx: No oropharyngeal exudate.  Eyes:     General: No scleral icterus.       Right eye: No discharge.        Left eye: No discharge.     Conjunctiva/sclera: Conjunctivae normal.     Pupils: Pupils are equal, round, and reactive to light.  Neck:     Thyroid: No thyromegaly.  Vascular: No JVD.  Cardiovascular:     Rate and Rhythm: Regular rhythm. Tachycardia present.     Heart sounds: Normal heart sounds. No murmur. No friction rub. No gallop.   Pulmonary:     Effort: Respiratory distress present.     Breath sounds: Rales present. No wheezing.  Abdominal:     General: Bowel sounds are normal. There is no distension.     Palpations: Abdomen is soft. There is no mass.     Tenderness: There is no abdominal tenderness.  Musculoskeletal:        General: No tenderness. Normal range of motion.     Cervical back: Normal range of motion and neck supple.  Lymphadenopathy:     Cervical: No cervical adenopathy.  Skin:    General: Skin is warm and dry.     Findings: No erythema or rash.  Neurological:     Mental Status: She  is alert.     Coordination: Coordination normal.  Psychiatric:        Behavior: Behavior normal.     ED Results / Procedures / Treatments   Labs (all labs ordered are listed, but only abnormal results are displayed) Labs Reviewed  RESPIRATORY PANEL BY RT PCR (FLU A&B, COVID) - Abnormal; Notable for the following components:      Result Value   SARS Coronavirus 2 by RT PCR POSITIVE (*)    All other components within normal limits  COMPREHENSIVE METABOLIC PANEL - Abnormal; Notable for the following components:   Potassium 3.1 (*)    Glucose, Bld 108 (*)    BUN <5 (*)    Creatinine, Ser 1.05 (*)    Calcium 7.9 (*)    Albumin 3.3 (*)    All other components within normal limits  COMPREHENSIVE METABOLIC PANEL - Abnormal; Notable for the following components:   Potassium 3.1 (*)    Glucose, Bld 101 (*)    BUN 5 (*)    Creatinine, Ser 1.07 (*)    Calcium 7.8 (*)    Albumin 3.3 (*)    All other components within normal limits  D-DIMER, QUANTITATIVE (NOT AT [facility]) - Abnormal; Notable for the following components:   D-Dimer, Quant 0.64 (*)    All other components within normal limits  LACTATE DEHYDROGENASE - Abnormal; Notable for the following components:   LDH 395 (*)    All other components within normal limits  FERRITIN - Abnormal; Notable for the following components:   Ferritin 404 (*)    All other components within normal limits  TRIGLYCERIDES - Abnormal; Notable for the following components:   Triglycerides 160 (*)    All other components within normal limits  FIBRINOGEN - Abnormal; Notable for the following components:   Fibrinogen 571 (*)    All other components within normal limits  C-REACTIVE PROTEIN - Abnormal; Notable for the following components:   CRP 15.9 (*)    All other components within normal limits  SARS CORONAVIRUS 2 (TAT 6-24 HRS)  CULTURE, BLOOD (ROUTINE X 2)  CULTURE, BLOOD (ROUTINE X 2)  LACTIC ACID, PLASMA  CBC WITH DIFFERENTIAL/PLATELET  LACTIC  ACID, PLASMA  CBC WITH DIFFERENTIAL/PLATELET  PROCALCITONIN  HCG, QUANTITATIVE, PREGNANCY  URINALYSIS, ROUTINE W REFLEX MICROSCOPIC  HCG, QUANTITATIVE, PREGNANCY  I-STAT BETA HCG BLOOD, ED ([facility], [facility], [facility] ONLY)    EKG EKG Interpretation  Date/Time:  Friday November 04 2019 10:15:43 EST Ventricular Rate:  108 PR Interval:  124 QRS Duration: 82 QT Interval:  348 QTC  Calculation: 466 R Axis:   49 Text Interpretation: Sinus tachycardia Cannot rule out Anterior infarct , age undetermined Abnormal ECG No old tracing to compare ' Confirmed by Eber Hong ([id number]) on 11/04/2019 10:59:54 AM   Radiology DG Chest 2 View  Result Date: 11/04/2019 CLINICAL DATA:  Cough and shortness of breath with hypoxia EXAM: CHEST - 2 VIEW COMPARISON:  None. FINDINGS: Cardiac shadow is at the upper limits of normal in size. Bilateral opacities are identified consistent with multifocal pneumonia. No sizable effusion is noted. No bony abnormality is seen. IMPRESSION: Multifocal pneumonia.  Correlate with COVID-19 test Electronically Signed   By: Alcide Clever M.D.   On: 11/04/2019 09:44   DG Chest Portable 1 View  Result Date: 11/04/2019 CLINICAL DATA:  Cough and shortness of breath.  Fever. EXAM: PORTABLE CHEST 1 VIEW COMPARISON:  November 04, 2019 study obtained earlier in the day FINDINGS: There is patchy airspace opacity bilaterally with small left pleural effusion. Heart is upper normal in size with pulmonary vascularity normal. No adenopathy. No bone lesions. IMPRESSION: Multifocal pneumonia with small left pleural effusion, stable. No new opacity compared to earlier in the day. Stable cardiac silhouette. No adenopathy evident. Electronically Signed   By: Bretta Bang III M.D.   On: 11/04/2019 11:04    Procedures .Critical Care Performed by: Eber Hong, MD Authorized by: Eber Hong, MD   Critical care provider statement:    Critical care time (minutes):  35   Critical care time was exclusive of:   Separately billable procedures and treating other patients and teaching time   Critical care was necessary to treat or prevent imminent or life-threatening deterioration of the following conditions:  Respiratory failure and sepsis   Critical care was time spent personally by me on the following activities:  Blood draw for specimens, development of treatment plan with patient or surrogate, discussions with consultants, evaluation of patient's response to treatment, examination of patient, obtaining history from patient or surrogate, ordering and performing treatments and interventions, ordering and review of laboratory studies, ordering and review of radiographic studies, pulse oximetry, re-evaluation of patient's condition and review of old charts Comments:         (including critical care time)  Medications Ordered in ED Medications  dexamethasone (DECADRON) injection 10 mg (has no administration in time range)  albuterol (VENTOLIN HFA) 108 (90 Base) MCG/ACT inhaler 2 puff (has no administration in time range)  cefTRIAXone (ROCEPHIN) 1 g in sodium chloride 0.9 % 100 mL IVPB (0 g Intravenous Stopped 11/04/19 1250)  azithromycin (ZITHROMAX) tablet 500 mg (500 mg Oral Given 11/04/19 1151)  acetaminophen (TYLENOL) tablet 1,000 mg (1,000 mg Oral Given 11/04/19 1151)    ED Course  I have reviewed the triage vital signs and the nursing notes.  Pertinent labs & imaging results that were available during my care of the patient were reviewed by me and considered in my medical decision making (see chart for details).    MDM Rules/Calculators/A&P                      I have personally viewed and interpreted the 2 view PA and lateral chest x-ray which shows multifocal infiltrate.  I think this is consistent with a coronavirus pneumonia or diffuse disseminated infection.  Given her fever of 103 and tachycardia she meets technical criteria for sepsis.  The patient denies any exposures, she will need  antibiotics to start however I suspect that she is coronavirus positive.  She has no signs of pulmonary embolism, no leg pain or swelling, no gastrointestinal symptoms.  She is requiring 2 L of oxygen for her hypoxia which was noted to be 87% on room air.  This patient will need to be admitted to the hospital for acute hypoxic respiratory failure likely related to Covid or possibly pulmonary pneumonia sepsis  Covid test is positive, inflammatory markers are elevated.  Oxygen has stayed good on supplemental oxygen but the patient remains tachypneic.  She was given steroids and albuterol, she will be admitted by the hospitalist and transfer to the [facility] campus to be treated for Covid.  Melanie Hunt was evaluated in Emergency Department on 11/04/2019 for the symptoms described in the history of present illness. She was evaluated in the context of the global COVID-19 pandemic, which necessitated consideration that the patient might be at risk for infection with the SARS-CoV-2 virus that causes COVID-19. Institutional protocols and algorithms that pertain to the evaluation of patients at risk for COVID-19 are in a state of rapid change based on information released by regulatory bodies including the CDC and federal and state organizations. These policies and algorithms were followed during the patient's care in the ED.   Final Clinical Impression(s) / ED Diagnoses Final diagnoses:  Acute hypoxemic respiratory failure due to COVID-19 [facility])    Rx / DC Orders ED Discharge Orders    None       Eber Hong, MD 11/04/19 1426

## 2019-11-04 NOTE — Discharge Instructions (Addendum)
Sending you to the ER for further evaluation and management

## 2019-11-04 NOTE — ED Triage Notes (Signed)
Patient sent down from [facility] for pneumonia. Reports shortness of breath, cough and fever. Patient states was given tylenol at [facility] and had POC covid done that showed negative. Patient arrives hypoxic and placed on oxygen in triage.

## 2019-11-04 NOTE — ED Notes (Signed)
Pt SpO2 85% on RA, RN to BS with O2 for eval. Provider, Dahlia Byes also at [facility]. SpO2 increased to 90% on RA while pt at rest. O2 at 2L placed via [place] and Sat increase to 92%. Then increase to 95%. Pt remains tachypneic at RR 30.  No acute distress.

## 2019-11-05 LAB — URINALYSIS, ROUTINE W REFLEX MICROSCOPIC
Bilirubin Urine: NEGATIVE
Glucose, UA: NEGATIVE mg/dL
Hgb urine dipstick: NEGATIVE
Ketones, ur: 5 mg/dL — AB
Nitrite: NEGATIVE
Protein, ur: 100 mg/dL — AB
Specific Gravity, Urine: 1.021 (ref 1.005–1.030)
pH: 6 (ref 5.0–8.0)

## 2019-11-05 LAB — COMPREHENSIVE METABOLIC PANEL
ALT: 29 U/L (ref 0–44)
AST: 35 U/L (ref 15–41)
Albumin: 3.6 g/dL (ref 3.5–5.0)
Alkaline Phosphatase: 78 U/L (ref 38–126)
Anion gap: 12 (ref 5–15)
BUN: 9 mg/dL (ref 6–20)
CO2: 26 mmol/L (ref 22–32)
Calcium: 8.3 mg/dL — ABNORMAL LOW (ref 8.9–10.3)
Chloride: 105 mmol/L (ref 98–111)
Creatinine, Ser: 0.7 mg/dL (ref 0.44–1.00)
GFR calc Af Amer: >60 mL/min (ref >60)
GFR calc non Af Amer: >60 mL/min (ref >60)
Glucose, Bld: 135 mg/dL — ABNORMAL HIGH (ref 70–99)
Potassium: 4.3 mmol/L (ref 3.5–5.1)
Sodium: 143 mmol/L (ref 135–145)
Total Bilirubin: 0.4 mg/dL (ref 0.3–1.2)
Total Protein: 7.2 g/dL (ref 6.5–8.1)

## 2019-11-05 LAB — FERRITIN: Ferritin: 415 ng/mL — ABNORMAL HIGH (ref 11–307)

## 2019-11-05 LAB — CBC WITH DIFFERENTIAL/PLATELET
Abs Immature Granulocytes: 0.01 10*3/uL (ref 0.00–0.07)
Basophils Absolute: 0 10*3/uL (ref 0.0–0.1)
Basophils Relative: 0 %
Eosinophils Absolute: 0 10*3/uL (ref 0.0–0.5)
Eosinophils Relative: 0 %
HCT: 37.8 % (ref 36.0–46.0)
Hemoglobin: 12.1 g/dL (ref 12.0–15.0)
Immature Granulocytes: 0 %
Lymphocytes Relative: 22 %
Lymphs Abs: 0.7 10*3/uL (ref 0.7–4.0)
MCH: 27.6 pg (ref 26.0–34.0)
MCHC: 32 g/dL (ref 30.0–36.0)
MCV: 86.3 fL (ref 80.0–100.0)
Monocytes Absolute: 0.1 10*3/uL (ref 0.1–1.0)
Monocytes Relative: 3 %
Neutro Abs: 2.4 10*3/uL (ref 1.7–7.7)
Neutrophils Relative %: 75 %
Platelets: 198 10*3/uL (ref 150–400)
RBC: 4.38 MIL/uL (ref 3.87–5.11)
RDW: 13.5 % (ref 11.5–15.5)
WBC: 3.2 10*3/uL — ABNORMAL LOW (ref 4.0–10.5)
nRBC: 0 % (ref 0.0–0.2)

## 2019-11-05 LAB — C-REACTIVE PROTEIN: CRP: 18.2 mg/dL — ABNORMAL HIGH (ref <1.0)

## 2019-11-05 LAB — ABO/RH: ABO/RH(D): O POS

## 2019-11-05 LAB — D-DIMER, QUANTITATIVE: D-Dimer, Quant: 0.42 ug/mL-FEU (ref 0.00–0.50)

## 2019-11-05 MED ORDER — INFLUENZA VAC SPLIT QUAD 0.5 ML IM SUSY
0.5000 mL | PREFILLED_SYRINGE | INTRAMUSCULAR | Status: DC | PRN
  Filled 2019-11-05: qty 0.5

## 2019-11-05 MED ORDER — ZOLPIDEM TARTRATE 5 MG PO TABS
5.0000 mg | ORAL_TABLET | Freq: Every evening | ORAL | Status: DC | PRN
  Administered 2019-11-05 – 2019-11-08 (×4): 5 mg via ORAL
  Filled 2019-11-05 (×4): qty 1

## 2019-11-05 NOTE — Progress Notes (Signed)
   11/05/19 0700  Family/Significant Other Communication  Family/Significant Other Update Called;Updated  Called an updated Mother per pt request. All questions asked and all questions answered.

## 2019-11-05 NOTE — Progress Notes (Signed)
SATURATION QUALIFICATIONS: (This note is used to comply with regulatory documentation for home oxygen)  Patient Saturations on Room Air at Rest = *91%  Patient Saturations on Room Air while Ambulating = 84%  Patient Saturations on 8 Liters of oxygen while Ambulating = 90%  Please briefly explain why patient needs home oxygen:

## 2019-11-05 NOTE — Progress Notes (Addendum)
Hospitalist progress note   Patient from home, Patient going likely home, Dispo needs to complete empiric therapies for coronavirus pneumonia in 2-3 days maybe complete remdisivir as OP Shandiin Eisenbeis [id number] DOB: 25-Oct-1985 DOA: 11/04/2019  PCP: Patient, No Pcp Per   Narrative:  34 year old black sexual female known metromenorrhagia followed at [facility], prior angioedema with intubation, obesity Admitted from [facility] ED with coronavirus 19 Started on remdesivir steroids On arrival because of elevated CRP given Actemra  Data Reviewed:  BUN/creatinine 9/0.7 potassium 3.1-->4.3 COVID-19 Labs  Recent Labs    11/04/19 1056 11/05/19 0115  DDIMER 0.64* 0.42  FERRITIN 404* 415*  LDH 395*  --   CRP 15.9* 18.2*    Lab Results  Component Value Date   SARSCOV2NAA POSITIVE (A) 11/04/2019   SARSCOV2NAA POSITIVE (A) 11/04/2019   SARSCOV2NAA NOT DETECTED 07/20/2019     Assessment & Plan:  Coronavirus 19 infection Steroids remdesivir stop date 2/16 Paradoxically CRP is increased-monitor trends Procalcitonin is relatively low so do not think too much about infection at this stage Mild leukopenia likely in keeping with internal infection platelets are okay in a.m. check LFTs in addition Hypoxic resp failrue 2/2 coviud desat to 88 on RA in bed-RN aware to ambulate around room Hypokalemia on admission Stop replacement has resolved at this time Prior history of metromenorrhagia Outpatient follow-up at [facility] Mild obesity Outpatient weight loss strategies  Called mother [phone]--ZT answer  Subjective: Looks more rested desdats going to bedsdiee commode and peak flow is low acc to RN No cp f chills rigor diarr Consultants:    Objective: Vitals:   11/04/19 1759 11/04/19 1943 11/05/19 0400 11/05/19 0715  BP: (!) 147/95 126/82 (!) 138/97 (!) 137/95  Pulse: 94   79  Resp: 18   (!) 26  Temp: 100 F (37.8 C) 98.8 F (37.1 C) 98 F (36.7 C) 97.7 F (36.5 C)  TempSrc: Oral Oral Oral  Axillary  SpO2:  96%  97%  Weight: 105 kg     Height: 5\' 7"  (1.702 m)       Intake/Output Summary (Last 24 hours) at 11/05/2019 0802 Last data filed at 11/05/2019 0700 Gross per 24 hour  Intake 490 ml  Output --  Net 490 ml   Filed Weights   11/04/19 1759  Weight: 105 kg    Examination: eomi ncat no focal deficit no lan cta b no adde dsound no rales no rhonchi abd obese nt nd  No le edema Euthymic and pelasant  Scheduled Meds: . albuterol  2 puff Inhalation Q6H  . vitamin C  500 mg Oral Daily  . dexamethasone  6 mg Oral Q24H  . enoxaparin (LOVENOX) injection  50 mg Subcutaneous Q24H  . guaiFENesin  1,200 mg Oral BID  . zinc sulfate  220 mg Oral Daily   Continuous Infusions: . remdesivir 100 mg in NS 100 mL 100 mg (11/05/19 0912)     LOS: 1 day   Time spent: 72 31, MD [facility]  11/05/2019, 8:02 AM

## 2019-11-05 NOTE — Plan of Care (Signed)
Went over Plan of Care with pt and mother per pt request. All questions asked and all questions answered.

## 2019-11-06 ENCOUNTER — Inpatient Hospital Stay (HOSPITAL_COMMUNITY): Payer: HRSA Program

## 2019-11-06 LAB — COMPREHENSIVE METABOLIC PANEL
ALT: 27 U/L (ref 0–44)
AST: 32 U/L (ref 15–41)
Albumin: 3.1 g/dL — ABNORMAL LOW (ref 3.5–5.0)
Alkaline Phosphatase: 72 U/L (ref 38–126)
Anion gap: 10 (ref 5–15)
BUN: 15 mg/dL (ref 6–20)
CO2: 25 mmol/L (ref 22–32)
Calcium: 8.4 mg/dL — ABNORMAL LOW (ref 8.9–10.3)
Chloride: 107 mmol/L (ref 98–111)
Creatinine, Ser: 0.84 mg/dL (ref 0.44–1.00)
GFR calc Af Amer: >60 mL/min (ref >60)
GFR calc non Af Amer: >60 mL/min (ref >60)
Glucose, Bld: 131 mg/dL — ABNORMAL HIGH (ref 70–99)
Potassium: 4.1 mmol/L (ref 3.5–5.1)
Sodium: 142 mmol/L (ref 135–145)
Total Bilirubin: 0.5 mg/dL (ref 0.3–1.2)
Total Protein: 7 g/dL (ref 6.5–8.1)

## 2019-11-06 LAB — C-REACTIVE PROTEIN: CRP: 10.4 mg/dL — ABNORMAL HIGH (ref <1.0)

## 2019-11-06 LAB — CBC WITH DIFFERENTIAL/PLATELET
Abs Immature Granulocytes: 0.04 10*3/uL (ref 0.00–0.07)
Basophils Absolute: 0 10*3/uL (ref 0.0–0.1)
Basophils Relative: 0 %
Eosinophils Absolute: 0 10*3/uL (ref 0.0–0.5)
Eosinophils Relative: 0 %
HCT: 36.1 % (ref 36.0–46.0)
Hemoglobin: 11.6 g/dL — ABNORMAL LOW (ref 12.0–15.0)
Immature Granulocytes: 1 %
Lymphocytes Relative: 18 %
Lymphs Abs: 1.2 10*3/uL (ref 0.7–4.0)
MCH: 27.6 pg (ref 26.0–34.0)
MCHC: 32.1 g/dL (ref 30.0–36.0)
MCV: 85.7 fL (ref 80.0–100.0)
Monocytes Absolute: 0.3 10*3/uL (ref 0.1–1.0)
Monocytes Relative: 5 %
Neutro Abs: 5 10*3/uL (ref 1.7–7.7)
Neutrophils Relative %: 76 %
Platelets: 261 10*3/uL (ref 150–400)
RBC: 4.21 MIL/uL (ref 3.87–5.11)
RDW: 13.8 % (ref 11.5–15.5)
WBC: 6.6 10*3/uL (ref 4.0–10.5)
nRBC: 0 % (ref 0.0–0.2)

## 2019-11-06 LAB — D-DIMER, QUANTITATIVE: D-Dimer, Quant: 0.35 ug/mL-FEU (ref 0.00–0.50)

## 2019-11-06 LAB — FERRITIN: Ferritin: 633 ng/mL — ABNORMAL HIGH (ref 11–307)

## 2019-11-06 MED ORDER — HYDROCOD POLST-CPM POLST ER 10-8 MG/5ML PO SUER
5.0000 mL | Freq: Two times a day (BID) | ORAL | Status: DC | PRN
  Administered 2019-11-07 – 2019-11-08 (×3): 5 mL via ORAL
  Filled 2019-11-06 (×3): qty 5

## 2019-11-06 MED ORDER — POLYETHYLENE GLYCOL 3350 17 G PO PACK
17.0000 g | PACK | Freq: Every day | ORAL | Status: DC
  Administered 2019-11-06 – 2019-11-08 (×3): 17 g via ORAL
  Filled 2019-11-06 (×3): qty 1

## 2019-11-06 MED ORDER — LABETALOL HCL 5 MG/ML IV SOLN
10.0000 mg | INTRAVENOUS | Status: DC | PRN
  Administered 2019-11-07: 10 mg via INTRAVENOUS
  Filled 2019-11-06: qty 4

## 2019-11-06 NOTE — Progress Notes (Signed)
Hospitalist progress note   Melanie Melanie, Melanie going likely Melanie, Dispo needs to complete empiric therapies for coronavirus pneumonia and improve significantly to be able to d/c Melanie Melanie [id number] DOB: 06/23/1986 DOA: 11/04/2019  PCP: Melanie, No Pcp Per   Narrative:  34 year old black sexual female known metromenorrhagia followed at [facility], prior angioedema with intubation, obesity Admitted from [facility] ED with coronavirus 19 Started on remdesivir steroids On arrival because of elevated CRP given Actemra She has stabilized to some degree but still requires oxygen  Data Reviewed:  BUN/creatinine 9/0.7-->15/0.84 potassium 3.1-->4.1 COVID-19 Labs  Recent Labs    11/04/19 1056 11/05/19 0115 11/06/19 0304  DDIMER 0.64* 0.42 0.35  FERRITIN 404* 415* 633*  LDH 395*  --   --   CRP 15.9* 18.2* 10.4*    Lab Results  Component Value Date   SARSCOV2NAA POSITIVE (A) 11/04/2019   SARSCOV2NAA POSITIVE (A) 11/04/2019   SARSCOV2NAA NOT DETECTED 07/20/2019     Assessment & Plan:  Coronavirus 19 infection Steroids remdesivir stop date 2/16 Overall stable requiring still 2-3 liters oxygen Hypoxic resp failrue 2/2 coviud desat to 88 on RA in bed-RN aware to ambulate around room She is still needing 2-3 liters and hopeful will imrpvoe Periodic CXR rpt liekly in am Hypokalemia on admission Stop replacement has resolved at this time Prior history of metromenorrhagia Outpatient follow-up at [facility] Mild obesity Outpatient weight loss strategies  Discussed on videochat with mother [phone] understands POC  Subjective: Looks more rested Eating drinking  No stool for past several days No cp cough some sputum   Consultants:    Objective: Vitals:   11/06/19 0458 11/06/19 0500 11/06/19 0700 11/06/19 0800  BP: (!) 140/91   133/74  Pulse: 88  76 80  Resp: (!) 35 (!) 22 (!) 31 (!) 36  Temp: 98.5 F (36.9 C)   98.7 F (37.1 C)  TempSrc: Oral   Oral  SpO2: 91%  92% 94%   Weight:      Height:        Intake/Output Summary (Last 24 hours) at 11/06/2019 1111 Last data filed at 11/05/2019 1900 Gross per 24 hour  Intake 250 ml  Output 700 ml  Net -450 ml   Filed Weights   11/04/19 1759  Weight: 105 kg    Examination: eomi ncat no focal deficit no lan cta b no added sound rales nor rhonchi abd obese nt nd  No le edema Euthymic and pelasant  Scheduled Meds: . albuterol  2 puff Inhalation Q6H  . vitamin C  500 mg Oral Daily  . dexamethasone  6 mg Oral Q24H  . enoxaparin (LOVENOX) injection  50 mg Subcutaneous Q24H  . guaiFENesin  1,200 mg Oral BID  . polyethylene glycol  17 g Oral Daily  . zinc sulfate  220 mg Oral Daily   Continuous Infusions: . remdesivir 100 mg in NS 100 mL 100 mg (11/06/19 1005)     LOS: 2 days   Time spent: 61 Pleas Koch, MD [facility]  11/06/2019, 11:11 AM

## 2019-11-07 ENCOUNTER — Inpatient Hospital Stay (HOSPITAL_COMMUNITY): Payer: HRSA Program

## 2019-11-07 LAB — CBC WITH DIFFERENTIAL/PLATELET
Abs Immature Granulocytes: 0.1 10*3/uL — ABNORMAL HIGH (ref 0.00–0.07)
Basophils Absolute: 0 10*3/uL (ref 0.0–0.1)
Basophils Relative: 0 %
Eosinophils Absolute: 0 10*3/uL (ref 0.0–0.5)
Eosinophils Relative: 0 %
HCT: 37.1 % (ref 36.0–46.0)
Hemoglobin: 11.7 g/dL — ABNORMAL LOW (ref 12.0–15.0)
Immature Granulocytes: 1 %
Lymphocytes Relative: 16 %
Lymphs Abs: 1.1 10*3/uL (ref 0.7–4.0)
MCH: 27.5 pg (ref 26.0–34.0)
MCHC: 31.5 g/dL (ref 30.0–36.0)
MCV: 87.3 fL (ref 80.0–100.0)
Monocytes Absolute: 0.3 10*3/uL (ref 0.1–1.0)
Monocytes Relative: 4 %
Neutro Abs: 5.5 10*3/uL (ref 1.7–7.7)
Neutrophils Relative %: 79 %
Platelets: 287 10*3/uL (ref 150–400)
RBC: 4.25 MIL/uL (ref 3.87–5.11)
RDW: 13.7 % (ref 11.5–15.5)
WBC: 7 10*3/uL (ref 4.0–10.5)
nRBC: 0.3 % — ABNORMAL HIGH (ref 0.0–0.2)

## 2019-11-07 LAB — COMPREHENSIVE METABOLIC PANEL
ALT: 33 U/L (ref 0–44)
AST: 37 U/L (ref 15–41)
Albumin: 3.2 g/dL — ABNORMAL LOW (ref 3.5–5.0)
Alkaline Phosphatase: 74 U/L (ref 38–126)
Anion gap: 9 (ref 5–15)
BUN: 19 mg/dL (ref 6–20)
CO2: 26 mmol/L (ref 22–32)
Calcium: 8 mg/dL — ABNORMAL LOW (ref 8.9–10.3)
Chloride: 100 mmol/L (ref 98–111)
Creatinine, Ser: 0.79 mg/dL (ref 0.44–1.00)
GFR calc Af Amer: >60 mL/min (ref >60)
GFR calc non Af Amer: >60 mL/min (ref >60)
Glucose, Bld: 123 mg/dL — ABNORMAL HIGH (ref 70–99)
Potassium: 4.1 mmol/L (ref 3.5–5.1)
Sodium: 135 mmol/L (ref 135–145)
Total Bilirubin: 0.7 mg/dL (ref 0.3–1.2)
Total Protein: 6.6 g/dL (ref 6.5–8.1)

## 2019-11-07 LAB — PROCALCITONIN: Procalcitonin: 0.1 ng/mL

## 2019-11-07 LAB — FERRITIN: Ferritin: 460 ng/mL — ABNORMAL HIGH (ref 11–307)

## 2019-11-07 LAB — NOVEL CORONAVIRUS, NAA (HOSP ORDER, SEND-OUT TO REF LAB; TAT 18-24 HRS): SARS-CoV-2, NAA: DETECTED — AB

## 2019-11-07 LAB — C-REACTIVE PROTEIN: CRP: 5.5 mg/dL — ABNORMAL HIGH (ref <1.0)

## 2019-11-07 LAB — D-DIMER, QUANTITATIVE: D-Dimer, Quant: 0.51 ug/mL-FEU — ABNORMAL HIGH (ref 0.00–0.50)

## 2019-11-07 NOTE — Progress Notes (Signed)
Hospitalist progress note   Patient from home, Patient going likely home, Dispo needs to complete empiric therapies for coronavirus pneumonia and improve significantly to be able to d/c home Melanie Hunt [id number] DOB: April 13, 1986 DOA: 11/04/2019  PCP: Patient, No Pcp Per   Narrative:  34 year old black sexual female known metromenorrhagia followed at [facility], prior angioedema with intubation, obesity Admitted from [facility] ED with coronavirus 19 Started on remdesivir steroids On arrival because of elevated CRP given Actemra She has stabilized to some degree but needed slightly more oxygen on 2/15 CXr was stable  Data Reviewed:  BUN/creatinine 9/0.7-->15/0.84 potassium 3.1-->4.1 COVID-19 Labs  Recent Labs    11/04/19 1056 11/04/19 1056 11/05/19 0115 11/06/19 0304 11/07/19 0415  DDIMER 0.64*   < > 0.42 0.35 0.51*  FERRITIN 404*   < > 415* 633* 460*  LDH 395*  --   --   --   --   CRP 15.9*   < > 18.2* 10.4* 5.5*   < > = values in this interval not displayed.    Lab Results  Component Value Date   SARSCOV2NAA POSITIVE (A) 11/04/2019   SARSCOV2NAA POSITIVE (A) 11/04/2019   SARSCOV2NAA DETECTED (A) 11/04/2019   SARSCOV2NAA NOT DETECTED 07/20/2019     Assessment & Plan:  Coronavirus 19 infection Steroids remdesivir stop date 2/16 Increasing oxygen req last pm to 5 liters-2/15 CXR look unchanged She might require hi flow but at this stage although working a little more would be reasonable to monitor on current unit Hypoxic resp failure 2/2 coviud As above Hypokalemia on admission Stop replacement has resolved at this time Prior history of metromenorrhagia Outpatient follow-up at [facility] Mild obesity Outpatient weight loss strategies  Discussed on videochat with mother [phone]  On 2/14understands POC  Subjective:  Overnight events noted no cp but more sputum Still tired  Some cough No diarr no cosnitpation   Consultants:    Objective: Vitals:   11/07/19 0210 11/07/19  0400 11/07/19 0700 11/07/19 0744  BP: 127/82 (!) 146/92  (!) 148/90  Pulse: 80 83 65   Resp: (!) 29 (!) 33 (!) 35 (!) 28  Temp:  99 F (37.2 C)  98.6 F (37 C)  TempSrc:  Oral  Oral  SpO2: 95% 94% 95%   Weight:      Height:       No intake or output data in the 24 hours ending 11/07/19 0925 Filed Weights   11/04/19 1759  Weight: 105 kg    Examination: Awake tired appearing fem No distress mild nasal flare and mild tachypnea decreased AE on LL lung fields No swelling in LE's  Scheduled Meds: . albuterol  2 puff Inhalation Q6H  . vitamin C  500 mg Oral Daily  . dexamethasone  6 mg Oral Q24H  . enoxaparin (LOVENOX) injection  50 mg Subcutaneous Q24H  . guaiFENesin  1,200 mg Oral BID  . polyethylene glycol  17 g Oral Daily  . zinc sulfate  220 mg Oral Daily   Continuous Infusions: . remdesivir 100 mg in NS 100 mL 100 mg (11/07/19 0851)     LOS: 3 days   Time spent: 67 Pleas Koch, MD [facility]  11/07/2019, 9:25 AM

## 2019-11-08 LAB — COMPREHENSIVE METABOLIC PANEL
ALT: 42 U/L (ref 0–44)
AST: 39 U/L (ref 15–41)
Albumin: 3.2 g/dL — ABNORMAL LOW (ref 3.5–5.0)
Alkaline Phosphatase: 72 U/L (ref 38–126)
Anion gap: 11 (ref 5–15)
BUN: 20 mg/dL (ref 6–20)
CO2: 25 mmol/L (ref 22–32)
Calcium: 8 mg/dL — ABNORMAL LOW (ref 8.9–10.3)
Chloride: 101 mmol/L (ref 98–111)
Creatinine, Ser: 0.94 mg/dL (ref 0.44–1.00)
GFR calc Af Amer: >60 mL/min (ref >60)
GFR calc non Af Amer: >60 mL/min (ref >60)
Glucose, Bld: 123 mg/dL — ABNORMAL HIGH (ref 70–99)
Potassium: 4 mmol/L (ref 3.5–5.1)
Sodium: 137 mmol/L (ref 135–145)
Total Bilirubin: 0.6 mg/dL (ref 0.3–1.2)
Total Protein: 6.5 g/dL (ref 6.5–8.1)

## 2019-11-08 LAB — CBC WITH DIFFERENTIAL/PLATELET
Abs Immature Granulocytes: 0.09 10*3/uL — ABNORMAL HIGH (ref 0.00–0.07)
Basophils Absolute: 0 10*3/uL (ref 0.0–0.1)
Basophils Relative: 1 %
Eosinophils Absolute: 0 10*3/uL (ref 0.0–0.5)
Eosinophils Relative: 0 %
HCT: 36.6 % (ref 36.0–46.0)
Hemoglobin: 11.8 g/dL — ABNORMAL LOW (ref 12.0–15.0)
Immature Granulocytes: 2 %
Lymphocytes Relative: 26 %
Lymphs Abs: 1.6 10*3/uL (ref 0.7–4.0)
MCH: 27.8 pg (ref 26.0–34.0)
MCHC: 32.2 g/dL (ref 30.0–36.0)
MCV: 86.3 fL (ref 80.0–100.0)
Monocytes Absolute: 0.2 10*3/uL (ref 0.1–1.0)
Monocytes Relative: 4 %
Neutro Abs: 4.1 10*3/uL (ref 1.7–7.7)
Neutrophils Relative %: 67 %
Platelets: 314 10*3/uL (ref 150–400)
RBC: 4.24 MIL/uL (ref 3.87–5.11)
RDW: 13.6 % (ref 11.5–15.5)
WBC: 6 10*3/uL (ref 4.0–10.5)
nRBC: 0.3 % — ABNORMAL HIGH (ref 0.0–0.2)

## 2019-11-08 LAB — D-DIMER, QUANTITATIVE: D-Dimer, Quant: 0.66 ug/mL-FEU — ABNORMAL HIGH (ref 0.00–0.50)

## 2019-11-08 LAB — C-REACTIVE PROTEIN: CRP: 3.3 mg/dL — ABNORMAL HIGH (ref <1.0)

## 2019-11-08 LAB — FERRITIN: Ferritin: 335 ng/mL — ABNORMAL HIGH (ref 11–307)

## 2019-11-08 NOTE — Progress Notes (Signed)
Hospitalist progress note   Patient from home, Patient going likely home, Dispo needs to complete empiric therapies for coronavirus pneumonia--likely can discharge home in the next 48 hours Melanie Hunt [phone] DOB: 30-Oct-1985 DOA: 11/04/2019  PCP: Patient, No Pcp Per   Narrative:  34 year old black sexual female known metromenorrhagia followed at [facility], prior angioedema with intubation, obesity Admitted from [facility] ED with coronavirus 19 Started on remdesivir steroids On arrival because of elevated CRP given Actemra She has stabilized to some degree but needed slightly more oxygen on 2/15 CXr was stable  Data Reviewed:  BUN/creatinine 9/0.7-->15/0.84-->20/0.9 potassium 3.1-->4.1-->4.0 COVID-19 Labs  Recent Labs    11/06/19 0304 11/07/19 0415 11/08/19 0311  DDIMER 0.35 0.51* 0.66*  FERRITIN 633* 460* 335*  CRP 10.4* 5.5* 3.3*    Lab Results  Component Value Date   SARSCOV2NAA POSITIVE (A) 11/04/2019   SARSCOV2NAA POSITIVE (A) 11/04/2019   SARSCOV2NAA DETECTED (A) 11/04/2019   SARSCOV2NAA NOT DETECTED 07/20/2019     Assessment & Plan:  Coronavirus 19 infection Steroids remdesivir stop date 2/16 Required 2/14 through 2/15 increased oxygen amounts to 5 L but seems to be coming back down into the 2 to 3 L range Ambulate in the room and monitor for desaturations If continues to tolerate well with no rebound and her inflammatory markers and/or further worsening of symptoms may be able to discharge within the next 24 to 48 hours Hypoxic resp failure 2/2 coviud As above Hypokalemia on admission Stop replacement has resolved at this time Prior history of metromenorrhagia Outpatient follow-up at [facility] Mild obesity Outpatient weight loss strategies  Discussed on videochat with mother [phone]  On 2/14understands POC  Subjective:  Sleepy this morning took Ambien last night no chest pain no fever no chills No nausea no vomiting Eating and drinking some No lower extremity  edema   Consultants:    Objective: Vitals:   11/07/19 1958 11/07/19 2333 11/08/19 0337 11/08/19 0745  BP: (!) 143/71 (!) 163/113 138/86   Pulse: 93 95 78   Resp: (!) 21 (!) 22 20 20   Temp: 98.7 F (37.1 C) 98.7 F (37.1 C) 98.6 F (37 C) 98 F (36.7 C)  TempSrc: Oral Oral Oral Oral  SpO2:  94% 96%   Weight:      Height:       No intake or output data in the 24 hours ending 11/08/19 1001 Filed Weights   11/04/19 1759  Weight: 105 kg    Examination: Sleepy appearing Neck neck no added sounds Chest clinically clear without added sound S1-S2 no murmur rub or gallop No lower extremity edema Abdomen soft nontender no rebound no guarding   Scheduled Meds: . albuterol  2 puff Inhalation Q6H  . vitamin C  500 mg Oral Daily  . dexamethasone  6 mg Oral Q24H  . enoxaparin (LOVENOX) injection  50 mg Subcutaneous Q24H  . guaiFENesin  1,200 mg Oral BID  . polyethylene glycol  17 g Oral Daily  . zinc sulfate  220 mg Oral Daily   Continuous Infusions: . remdesivir 100 mg in NS 100 mL 100 mg (11/08/19 0954)     LOS: 4 days   Time spent: 83 15, MD [facility]  11/08/2019, 10:01 AM

## 2019-11-09 DIAGNOSIS — E669 Obesity, unspecified: Secondary | ICD-10-CM

## 2019-11-09 DIAGNOSIS — D649 Anemia, unspecified: Secondary | ICD-10-CM

## 2019-11-09 LAB — CBC WITH DIFFERENTIAL/PLATELET
Abs Immature Granulocytes: 0.11 10*3/uL — ABNORMAL HIGH (ref 0.00–0.07)
Basophils Absolute: 0 10*3/uL (ref 0.0–0.1)
Basophils Relative: 1 %
Eosinophils Absolute: 0.1 10*3/uL (ref 0.0–0.5)
Eosinophils Relative: 1 %
HCT: 37 % (ref 36.0–46.0)
Hemoglobin: 11.8 g/dL — ABNORMAL LOW (ref 12.0–15.0)
Immature Granulocytes: 2 %
Lymphocytes Relative: 35 %
Lymphs Abs: 2.3 10*3/uL (ref 0.7–4.0)
MCH: 27.8 pg (ref 26.0–34.0)
MCHC: 31.9 g/dL (ref 30.0–36.0)
MCV: 87.1 fL (ref 80.0–100.0)
Monocytes Absolute: 0.4 10*3/uL (ref 0.1–1.0)
Monocytes Relative: 6 %
Neutro Abs: 3.5 10*3/uL (ref 1.7–7.7)
Neutrophils Relative %: 55 %
Platelets: 315 10*3/uL (ref 150–400)
RBC: 4.25 MIL/uL (ref 3.87–5.11)
RDW: 13.6 % (ref 11.5–15.5)
WBC: 6.4 10*3/uL (ref 4.0–10.5)
nRBC: 0.3 % — ABNORMAL HIGH (ref 0.0–0.2)

## 2019-11-09 LAB — COMPREHENSIVE METABOLIC PANEL
ALT: 49 U/L — ABNORMAL HIGH (ref 0–44)
AST: 42 U/L — ABNORMAL HIGH (ref 15–41)
Albumin: 3 g/dL — ABNORMAL LOW (ref 3.5–5.0)
Alkaline Phosphatase: 70 U/L (ref 38–126)
Anion gap: 8 (ref 5–15)
BUN: 20 mg/dL (ref 6–20)
CO2: 26 mmol/L (ref 22–32)
Calcium: 8.1 mg/dL — ABNORMAL LOW (ref 8.9–10.3)
Chloride: 105 mmol/L (ref 98–111)
Creatinine, Ser: 0.87 mg/dL (ref 0.44–1.00)
GFR calc Af Amer: >60 mL/min (ref >60)
GFR calc non Af Amer: >60 mL/min (ref >60)
Glucose, Bld: 103 mg/dL — ABNORMAL HIGH (ref 70–99)
Potassium: 4.1 mmol/L (ref 3.5–5.1)
Sodium: 139 mmol/L (ref 135–145)
Total Bilirubin: 0.5 mg/dL (ref 0.3–1.2)
Total Protein: 6.3 g/dL — ABNORMAL LOW (ref 6.5–8.1)

## 2019-11-09 LAB — CULTURE, BLOOD (ROUTINE X 2)
Culture: NO GROWTH
Culture: NO GROWTH
Special Requests: ADEQUATE
Special Requests: ADEQUATE

## 2019-11-09 LAB — C-REACTIVE PROTEIN: CRP: 2 mg/dL — ABNORMAL HIGH (ref <1.0)

## 2019-11-09 LAB — FERRITIN: Ferritin: 253 ng/mL (ref 11–307)

## 2019-11-09 LAB — D-DIMER, QUANTITATIVE: D-Dimer, Quant: 0.59 ug/mL-FEU — ABNORMAL HIGH (ref 0.00–0.50)

## 2019-11-09 MED ORDER — HYDROCOD POLST-CPM POLST ER 10-8 MG/5ML PO SUER: 5.0000 mL | Freq: Two times a day (BID) | ORAL | 0 refills | Status: DC | PRN

## 2019-11-09 NOTE — Discharge Summary (Addendum)
Physician Discharge Summary  Melanie Hunt BPZ:[id number] DOB: [birth year] DOA: 11/04/2019  PCP: Patient, No Pcp Per  Admit date: 11/04/2019 Discharge date: 11/09/2019  Admitted From: Home Disposition: Home  Recommendations for Outpatient Follow-up:  1. Encourage weight loss 2. Check hemoglobin along with iron levels in 1 month   Discharge Condition: Stable CODE STATUS: Full code  Diet recommendation: Heart healthy Consultations:  None   Discharge Diagnoses:  Principal Problem:   Acute hypoxemic respiratory failure due to COVID-19 [facility]) Active Problems:   Anemia   Obesity (BMI 35.0-39.9 without comorbidity)    Brief Summary: This is a 34 year old female with a history of menorrhagia, angioedema with intubation in the past, obesity who presented to [facility] from [facility], ED with COVID-19 infection and acute hypoxic respiratory failure. Patient was started on remdesivir and steroids.  Hospital Course:  Acute hypoxic respiratory failure secondary to COVID-19 pneumonia -Initial chest x-ray on 2/12 was consistent with multifocal pneumonia -The patient was treated with remdesivir, IV steroids and Actemra -Covid inflammatory markers are improving -She has improved clinically as well and is stable to be discharged home today  Anemia possibly secondary to menorrhagia -Hemoglobin has been in the 11-12 range and is stable  Obesity -Needs to lose weight Body mass index is 36.26 kg/m.    Discharge Exam: Vitals:   11/09/19 0000 11/09/19 0440  BP: 128/85 131/83  Pulse: 81 70  Resp: 19 20  Temp: 98.8 F (37.1 C) 98.8 F (37.1 C)  SpO2: 91% 92%   Vitals:   11/08/19 0745 11/08/19 1950 11/09/19 0000 11/09/19 0440  BP:  134/89 128/85 131/83  Pulse:  73 81 70  Resp: 20 20 19 20   Temp: 98 F (36.7 C) 98.8 F (37.1 C) 98.8 F (37.1 C) 98.8 F (37.1 C)  TempSrc: Oral Oral Oral Oral  SpO2:  99% 91% 92%  Weight:      Height:        General: Pt is alert,  awake, not in acute distress Cardiovascular: RRR, S1/S2 +, no rubs, no gallops Respiratory: CTA bilaterally, no wheezing, no rhonchi Abdominal: Soft, NT, ND, bowel sounds + Extremities: no edema, no cyanosis   Discharge Instructions  Discharge Instructions    Diet - low sodium heart healthy   Complete by: As directed    Increase activity slowly   Complete by: As directed    MyChart COVID-19 home monitoring program   Complete by: Nov 09, 2019    Is the patient willing to use the MyChart Mobile App for home monitoring?: Yes   Temperature monitoring   Complete by: Nov 09, 2019    After how many days would you like to receive a notification of this patient's flowsheet entries?: 1     Allergies as of 11/09/2019      Reactions   No Known Allergies       Medication List    TAKE these medications   acetaminophen 500 MG tablet Commonly known as: TYLENOL Take 1,000 mg by mouth every 6 (six) hours as needed for mild pain.   ALKA-SELTZER PLUS COLD & FLU PO Take by mouth.   chlorpheniramine-HYDROcodone 10-8 MG/5ML Suer Commonly known as: TUSSIONEX Take 5 mLs by mouth every 12 (twelve) hours as needed for cough.   guaiFENesin 600 MG 12 hr tablet Commonly known as: MUCINEX Take by mouth 2 (two) times daily.            Durable Medical Equipment  (From admission, onward)  Start     Ordered   11/08/19 1511  DME Oxygen  (Discharge Planning)  Once    Question Answer Comment  Length of Need 6 Months   Mode or (Route) Nasal cannula   Liters per Minute 4   Frequency Continuous (stationary and portable oxygen unit needed)   Oxygen conserving device Yes   Oxygen delivery system Gas      11/08/19 1510         Follow-up Information    Primary Care Provider. Schedule an appointment as soon as possible for a visit in 1 week(s).   Why: Check hemoglobin along with iron levels in 1 month        [facility] Follow up.   Why: You have  an apt on March 9 at 3:00 pm with Dr. Shan LevansPatrick Wright Contact information: [address] [place] [phone]         Allergies  Allergen Reactions  . No Known Allergies      Procedures/Studies:   DG Chest 2 View  Result Date: 11/04/2019 CLINICAL DATA:  Cough and shortness of breath with hypoxia EXAM: CHEST - 2 VIEW COMPARISON:  None. FINDINGS: Cardiac shadow is at the upper limits of normal in size. Bilateral opacities are identified consistent with multifocal pneumonia. No sizable effusion is noted. No bony abnormality is seen. IMPRESSION: Multifocal pneumonia.  Correlate with COVID-19 test Electronically Signed   By: Alcide CleverMark  Lukens M.D.   On: 11/04/2019 09:44   DG Chest Port 1 View  Result Date: 11/07/2019 CLINICAL DATA:  COVID-19 pneumonia. EXAM: PORTABLE CHEST 1 VIEW COMPARISON:  11/06/2019. FINDINGS: Trachea is midline. Heart size stable. Lungs are low in volume with diffuse bilateral airspace opacification, similar to yesterday's exam. No definite pleural fluid. IMPRESSION: COVID-19 pneumonia, stable from 11/06/2019. Electronically Signed   By: Leanna BattlesMelinda  Blietz M.D.   On: 11/07/2019 11:42   DG Chest Port 1 View  Result Date: 11/06/2019 CLINICAL DATA:  Pneumonia. EXAM: PORTABLE CHEST 1 VIEW COMPARISON:  Chest radiograph 11/04/2019 FINDINGS: Stable cardiomediastinal contours. Low lung volumes. There are persistent diffuse bilateral airspace opacities. No pneumothorax or large pleural effusion. No acute finding in the visualized skeleton. IMPRESSION: Stable chest with persistent diffuse bilateral airspace opacities. Electronically Signed   By: Emmaline KluverNancy  Ballantyne M.D.   On: 11/06/2019 11:30   DG Chest Portable 1 View  Result Date: 11/04/2019 CLINICAL DATA:  Cough and shortness of breath.  Fever. EXAM: PORTABLE CHEST 1 VIEW COMPARISON:  November 04, 2019 study obtained earlier in the day FINDINGS: There is patchy airspace opacity bilaterally with small  left pleural effusion. Heart is upper normal in size with pulmonary vascularity normal. No adenopathy. No bone lesions. IMPRESSION: Multifocal pneumonia with small left pleural effusion, stable. No new opacity compared to earlier in the day. Stable cardiac silhouette. No adenopathy evident. Electronically Signed   By: Bretta BangWilliam  Woodruff III M.D.   On: 11/04/2019 11:04     The results of significant diagnostics from this hospitalization (including imaging, microbiology, ancillary and laboratory) are listed below for reference.     Microbiology: Recent Results (from the past 240 hour(s))  Novel Coronavirus, NAA (Hosp order, Send-out to Ref Lab; TAT 18-24 hrs     Status: Abnormal   Collection Time: 11/04/19  9:39 AM   Specimen: Nasopharyngeal Swab; Respiratory  Result Value Ref Range Status   SARS-CoV-2, NAA DETECTED (A) NOT DETECTED Final    Comment: (NOTE)  Client Requested Flag This nucleic acid amplification test was developed and its performance characteristics determined by [employer]. Nucleic acid amplification tests include RT-PCR and TMA. This test has not been FDA cleared or approved. This test has been authorized by FDA under an Emergency Use Authorization (EUA). This test is only authorized for the duration of time the declaration that circumstances exist justifying the authorization of the emergency use of in vitro diagnostic tests for detection of SARS-CoV-2 virus and/or diagnosis of COVID-19 infection under section 564(b)(1) of the Act, 21 U.S.C. 144YJE-5(U) (1), unless the authorization is terminated or revoked sooner. When diagnostic testing is negative, the possibility of a false negative result should be considered in the context of a patient's recent exposures and the presence of clinical signs and symptoms consistent with COVID-19. An individual without symptoms of COVID - 19 and who is not shedding SARS-CoV-2 virus would expect to have  a negative (not detected) result in this assay. Performed At: [facility] [address] [place], [place] [id number] Jolene Schimke MD ZC:[id number]    Coronavirus Source NASOPHARYNGEAL  Final    Comment: Performed at [facility], [address] [place], [place] [address]  SARS CORONAVIRUS 2 (TAT 6-24 HRS) Nasopharyngeal Nasopharyngeal Swab     Status: Abnormal   Collection Time: 11/04/19 10:56 AM   Specimen: Nasopharyngeal Swab  Result Value Ref Range Status   SARS Coronavirus 2 POSITIVE (A) NEGATIVE Final    Comment: RESULT CALLED TO, READ BACK BY AND VERIFIED WITH: M.HENSOL RN 1748 11/04/19 MCCORMICK K (NOTE) SARS-CoV-2 target nucleic acids are DETECTED. The SARS-CoV-2 RNA is generally detectable in upper and lower respiratory specimens during the acute phase of infection. Positive results are indicative of the presence of SARS-CoV-2 RNA. Clinical correlation with patient history and other diagnostic information is  necessary to determine patient infection status. Positive results do not rule out bacterial infection or co-infection with other viruses.  The expected result is Negative. Fact Sheet for Patients: HairSlick.no Fact Sheet for Healthcare Providers: quierodirigir.com This test is not yet approved or cleared by the [place] FDA and  has been authorized for detection and/or diagnosis of SARS-CoV-2 by FDA under an Emergency Use Authorization (EUA). This EUA will remain  in effect (meaning this test can be used) for t he duration of the COVID-19 declaration under Section 564(b)(1) of the Act, 21 U.S.C. section 360bbb-3(b)(1), unless the authorization is terminated or revoked sooner. Performed at [facility], [address], [place], [place] [address]   Blood Culture (routine x 2)     Status: None   Collection Time: 11/04/19 10:56 AM   Specimen: Right Antecubital; Blood  Result Value Ref  Range Status   Specimen Description RIGHT ANTECUBITAL  Final   Special Requests   Final    BOTTLES DRAWN AEROBIC AND ANAEROBIC Blood Culture adequate volume   Culture   Final    NO GROWTH 5 DAYS Performed at [facility], [address] [place], [place] [address]    Report Status 11/09/2019 FINAL  Final  Blood Culture (routine x 2)     Status: None   Collection Time: 11/04/19 11:01 AM   Specimen: BLOOD  Result Value Ref Range Status   Specimen Description BLOOD RIGHT ANTECUBITAL  Final   Special Requests   Final    BOTTLES DRAWN AEROBIC AND ANAEROBIC Blood Culture adequate volume   Culture   Final    NO GROWTH 5 DAYS Performed at  [facility], [address] [place] [address] [place], [place] [address]    Report Status 11/09/2019 FINAL  Final  Respiratory Panel by RT PCR (Flu A&B, Covid) - Nasopharyngeal Swab     Status: Abnormal   Collection Time: 11/04/19 12:06 PM   Specimen: Nasopharyngeal Swab  Result Value Ref Range Status   SARS Coronavirus 2 by RT PCR POSITIVE (A) NEGATIVE Final    Comment: RESULT CALLED TO, READ BACK BY AND VERIFIED WITH: Penni Bombard RN 14:10 11/04/19 (wilsonm) (NOTE) SARS-CoV-2 target nucleic acids are DETECTED. SARS-CoV-2 RNA is generally detectable in upper respiratory specimens  during the acute phase of infection. Positive results are indicative of the presence of the identified virus, but do not rule out bacterial infection or co-infection with other pathogens not detected by the test. Clinical correlation with patient history and other diagnostic information is necessary to determine patient infection status. The expected result is Negative. Fact Sheet for Patients:  https://www.moore.com/ Fact Sheet for Healthcare Providers: https://www.young.biz/ This test is not yet approved or cleared by the [place] FDA and  has been authorized for detection and/or diagnosis of SARS-CoV-2 by FDA under an Emergency Use  Authorization (EUA).  This EUA will remain in effect (meaning this test can be used) fo r the duration of  the COVID-19 declaration under Section 564(b)(1) of the Act, 21 U.S.C. section 360bbb-3(b)(1), unless the authorization is terminated or revoked sooner.    Influenza A by PCR NEGATIVE NEGATIVE Final   Influenza B by PCR NEGATIVE NEGATIVE Final    Comment: (NOTE) The Xpert Xpress SARS-CoV-2/FLU/RSV assay is intended as an aid in  the diagnosis of influenza from Nasopharyngeal swab specimens and  should not be used as a sole basis for treatment. Nasal washings and  aspirates are unacceptable for Xpert Xpress SARS-CoV-2/FLU/RSV  testing. Fact Sheet for Patients: https://www.moore.com/ Fact Sheet for Healthcare Providers: https://www.young.biz/ This test is not yet approved or cleared by the [place] FDA and  has been authorized for detection and/or diagnosis of SARS-CoV-2 by  FDA under an Emergency Use Authorization (EUA). This EUA will remain  in effect (meaning this test can be used) for the duration of the  Covid-19 declaration under Section 564(b)(1) of the Act, 21  U.S.C. section 360bbb-3(b)(1), unless the authorization is  terminated or revoked. Performed at [facility], [address], [place], [place] [address]      Labs: BNP (last 3 results) No results for input(s): BNP in the last 8760 hours. Basic Metabolic Panel: Recent Labs  Lab 11/05/19 0115 11/06/19 0304 11/07/19 0415 11/08/19 0311 11/09/19 0242  NA 143 142 135 137 139  K 4.3 4.1 4.1 4.0 4.1  CL 105 107 100 101 105  CO2 26 25 26 25 26   GLUCOSE 135* 131* 123* 123* 103*  BUN 9 15 19 20 20   CREATININE 0.70 0.84 0.79 0.94 0.87  CALCIUM 8.3* 8.4* 8.0* 8.0* 8.1*   Liver Function Tests: Recent Labs  Lab 11/05/19 0115 11/06/19 0304 11/07/19 0415 11/08/19 0311 11/09/19 0242  AST 35 32 37 39 42*  ALT 29 27 33 42 49*  ALKPHOS 78 72 74 72 70  BILITOT  0.4 0.5 0.7 0.6 0.5  PROT 7.2 7.0 6.6 6.5 6.3*  ALBUMIN 3.6 3.1* 3.2* 3.2* 3.0*   No results for input(s): LIPASE, AMYLASE in the last 168 hours. No results for input(s): AMMONIA in the last 168 hours. CBC: Recent Labs  Lab 11/05/19 0115 11/06/19 0304 11/07/19 0415 11/08/19  8270 11/09/19 0242  WBC 3.2* 6.6 7.0 6.0 6.4  NEUTROABS 2.4 5.0 5.5 4.1 3.5  HGB 12.1 11.6* 11.7* 11.8* 11.8*  HCT 37.8 36.1 37.1 36.6 37.0  MCV 86.3 85.7 87.3 86.3 87.1  PLT 198 261 287 314 315   Cardiac Enzymes: No results for input(s): CKTOTAL, CKMB, CKMBINDEX, TROPONINI in the last 168 hours. BNP: Invalid input(s): POCBNP CBG: No results for input(s): GLUCAP in the last 168 hours. D-Dimer Recent Labs    11/08/19 0311 11/09/19 0242  DDIMER 0.66* 0.59*   Hgb A1c No results for input(s): HGBA1C in the last 72 hours. Lipid Profile No results for input(s): CHOL, HDL, LDLCALC, TRIG, CHOLHDL, LDLDIRECT in the last 72 hours. Thyroid function studies No results for input(s): TSH, T4TOTAL, T3FREE, THYROIDAB in the last 72 hours.  Invalid input(s): FREET3 Anemia work up Recent Labs    11/08/19 0311 11/09/19 0242  FERRITIN 335* 253   Urinalysis    Component Value Date/Time   COLORURINE YELLOW 11/05/2019 0931   APPEARANCEUR CLEAR 11/05/2019 0931   LABSPEC 1.021 11/05/2019 0931   PHURINE 6.0 11/05/2019 0931   GLUCOSEU NEGATIVE 11/05/2019 0931   HGBUR NEGATIVE 11/05/2019 0931   BILIRUBINUR NEGATIVE 11/05/2019 0931   KETONESUR 5 (A) 11/05/2019 0931   PROTEINUR 100 (A) 11/05/2019 0931   NITRITE NEGATIVE 11/05/2019 0931   LEUKOCYTESUR SMALL (A) 11/05/2019 0931   Sepsis Labs Invalid input(s): PROCALCITONIN,  WBC,  LACTICIDVEN Microbiology Recent Results (from the past 240 hour(s))  Novel Coronavirus, NAA (Hosp order, Send-out to Ref Lab; TAT 18-24 hrs     Status: Abnormal   Collection Time: 11/04/19  9:39 AM   Specimen: Nasopharyngeal Swab; Respiratory  Result Value Ref Range Status    SARS-CoV-2, NAA DETECTED (A) NOT DETECTED Final    Comment: (NOTE)                  Client Requested Flag This nucleic acid amplification test was developed and its performance characteristics determined by [employer]. Nucleic acid amplification tests include RT-PCR and TMA. This test has not been FDA cleared or approved. This test has been authorized by FDA under an Emergency Use Authorization (EUA). This test is only authorized for the duration of time the declaration that circumstances exist justifying the authorization of the emergency use of in vitro diagnostic tests for detection of SARS-CoV-2 virus and/or diagnosis of COVID-19 infection under section 564(b)(1) of the Act, 21 U.S.C. 786LJQ-4(B) (1), unless the authorization is terminated or revoked sooner. When diagnostic testing is negative, the possibility of a false negative result should be considered in the context of a patient's recent exposures and the presence of clinical signs and symptoms consistent with COVID-19. An individual without symptoms of COVID - 19 and who is not shedding SARS-CoV-2 virus would expect to have a negative (not detected) result in this assay. Performed At: [facility] [address] [place], [place] [id number] Jolene Schimke MD FX:[id number]    Coronavirus Source NASOPHARYNGEAL  Final    Comment: Performed at [facility], [address], [place], [place] [address]  SARS CORONAVIRUS 2 (TAT 6-24 HRS) Nasopharyngeal Nasopharyngeal Swab     Status: Abnormal   Collection Time: 11/04/19 10:56 AM   Specimen: Nasopharyngeal Swab  Result Value Ref Range Status   SARS Coronavirus 2 POSITIVE (A) NEGATIVE Final    Comment: RESULT CALLED TO, READ BACK BY AND VERIFIED WITH: M.HENSOL RN 1748 11/04/19 MCCORMICK K (NOTE) SARS-CoV-2 target nucleic acids are DETECTED. The  SARS-CoV-2 RNA is generally detectable in upper and lower respiratory specimens during the acute phase of  infection. Positive results are indicative of the presence of SARS-CoV-2 RNA. Clinical correlation with patient history and other diagnostic information is  necessary to determine patient infection status. Positive results do not rule out bacterial infection or co-infection with other viruses.  The expected result is Negative. Fact Sheet for Patients: SugarRoll.be Fact Sheet for Healthcare Providers: https://www.woods-mathews.com/ This test is not yet approved or cleared by the [place] FDA and  has been authorized for detection and/or diagnosis of SARS-CoV-2 by FDA under an Emergency Use Authorization (EUA). This EUA will remain  in effect (meaning this test can be used) for t he duration of the COVID-19 declaration under Section 564(b)(1) of the Act, 21 U.S.C. section 360bbb-3(b)(1), unless the authorization is terminated or revoked sooner. Performed at [facility], [place] [address] [place], [place] [address]   Blood Culture (routine x 2)     Status: None   Collection Time: 11/04/19 10:56 AM   Specimen: Right Antecubital; Blood  Result Value Ref Range Status   Specimen Description RIGHT ANTECUBITAL  Final   Special Requests   Final    BOTTLES DRAWN AEROBIC AND ANAEROBIC Blood Culture adequate volume   Culture   Final    NO GROWTH 5 DAYS Performed at [facility], [place] [address], [place], [place] [address]    Report Status 11/09/2019 FINAL  Final  Blood Culture (routine x 2)     Status: None   Collection Time: 11/04/19 11:01 AM   Specimen: BLOOD  Result Value Ref Range Status   Specimen Description BLOOD RIGHT ANTECUBITAL  Final   Special Requests   Final    BOTTLES DRAWN AEROBIC AND ANAEROBIC Blood Culture adequate volume   Culture   Final    NO GROWTH 5 DAYS Performed at [facility], [place] [address], [place], [place] [address]    Report Status 11/09/2019 FINAL  Final  Respiratory Panel by RT PCR (Flu A&B,  Covid) - Nasopharyngeal Swab     Status: Abnormal   Collection Time: 11/04/19 12:06 PM   Specimen: Nasopharyngeal Swab  Result Value Ref Range Status   SARS Coronavirus 2 by RT PCR POSITIVE (A) NEGATIVE Final    Comment: RESULT CALLED TO, READ BACK BY AND VERIFIED WITH: Talbert Forest RN 14:10 11/04/19 (wilsonm) (NOTE) SARS-CoV-2 target nucleic acids are DETECTED. SARS-CoV-2 RNA is generally detectable in upper respiratory specimens  during the acute phase of infection. Positive results are indicative of the presence of the identified virus, but do not rule out bacterial infection or co-infection with other pathogens not detected by the test. Clinical correlation with patient history and other diagnostic information is necessary to determine patient infection status. The expected result is Negative. Fact Sheet for Patients:  PinkCheek.be Fact Sheet for Healthcare Providers: GravelBags.it This test is not yet approved or cleared by the [place] FDA and  has been authorized for detection and/or diagnosis of SARS-CoV-2 by FDA under an Emergency Use Authorization (EUA).  This EUA will remain in effect (meaning this test can be used) fo r the duration of  the COVID-19 declaration under Section 564(b)(1) of the Act, 21 U.S.C. section 360bbb-3(b)(1), unless the authorization is terminated or revoked sooner.    Influenza A by PCR NEGATIVE NEGATIVE Final   Influenza B by PCR NEGATIVE NEGATIVE Final    Comment: (NOTE) The Xpert Xpress SARS-CoV-2/FLU/RSV assay is intended  as an aid in  the diagnosis of influenza from Nasopharyngeal swab specimens and  should not be used as a sole basis for treatment. Nasal washings and  aspirates are unacceptable for Xpert Xpress SARS-CoV-2/FLU/RSV  testing. Fact Sheet for Patients: https://www.moore.com/https://www.fda.gov/media/142436/download Fact Sheet for Healthcare  Providers: https://www.young.biz/https://www.fda.gov/media/142435/download This test is not yet approved or cleared by the [place] FDA and  has been authorized for detection and/or diagnosis of SARS-CoV-2 by  FDA under an Emergency Use Authorization (EUA). This EUA will remain  in effect (meaning this test can be used) for the duration of the  Covid-19 declaration under Section 564(b)(1) of the Act, 21  U.S.C. section 360bbb-3(b)(1), unless the authorization is  terminated or revoked. Performed at [facility], [address] [place], [place] [id number]      Time coordinating discharge in minutes: 60  SIGNED:   Calvert CantorSaima Hadlyn Amero, MD  [facility] 11/09/2019, 2:43 PM Pager   If 7PM-7AM, please contact night-coverage [email or web address] Password [email or web address]

## 2019-11-09 NOTE — Discharge Instructions (Signed)

## 2019-11-09 NOTE — TOC Progression Note (Signed)
Transition of Care [facility]) - Progression Note    Patient Details  Name: Eloyce Bultman MRN: [id number] Date of Birth: 04/03/1986  Transition of [facility] CM/SW Contact  Gildardo Griffes, [place] Phone Number: 11/09/2019, 12:16 PM  Clinical Narrative:      TOC meds to be delivered to bedside, patient is set up with PCP apt at [facility] for March 9 at 3pm with Dr. [name], information provided on dc packet.   No further needs identified at this time.       Expected Discharge Plan and Services     Discharge Planning Services: CM Consult, [facility]     Expected Discharge Date: 11/09/19                                     Social Determinants of Health (SDOH) Interventions    Readmission Risk Interventions No flowsheet data found.

## 2019-11-09 NOTE — TOC Initial Note (Signed)
Transition of Care [facility]) - Initial/Assessment Note    Patient Details  Name: Melanie Hunt MRN: [id number] Date of Birth: 04-01-1986  Transition of Care [facility]) CM/SW Contact:    Golda Acre, RN Phone Number: 11/09/2019, 10:10 AM  Clinical Narrative:                 Match program performed number for pharmacy,will be [phone].        Patient Goals and CMS Choice        Expected Discharge Plan and Services     Discharge Planning Services: CM Consult, [facility]     Expected Discharge Date: 11/09/19                                    Prior Living Arrangements/Services                       Activities of Daily Living Home Assistive Devices/Equipment: None ADL Screening (condition at time of admission) Patient's cognitive ability adequate to safely complete daily activities?: Yes Is the patient deaf or have difficulty hearing?: No Does the patient have difficulty seeing, even when wearing glasses/contacts?: No Does the patient have difficulty concentrating, remembering, or making decisions?: No Patient able to express need for assistance with ADLs?: Yes Does the patient have difficulty dressing or bathing?: No Independently performs ADLs?: Yes (appropriate for developmental age) Does the patient have difficulty walking or climbing stairs?: No Weakness of Legs: None Weakness of Arms/Hands: None  Permission Sought/Granted                  Emotional Assessment              Admission diagnosis:  Acute hypoxemic respiratory failure due to COVID-19 (HCC) [U07.1, J96.01] COVID-19 [U07.1] Patient Active Problem List   Diagnosis Date Noted  . Anemia 11/09/2019  . Obesity (BMI 35.0-39.9 without comorbidity) 11/09/2019  . Acute hypoxemic respiratory failure due to COVID-19 [facility]) 11/04/2019   PCP:  Patient, No Pcp Per Pharmacy:   [id number] [place], [place] - [address]  [place] [address] Phone: [phone] Fax: [phone]     Social Determinants of Health (SDOH) Interventions    Readmission Risk Interventions No flowsheet data found.

## 2019-11-29 ENCOUNTER — Other Ambulatory Visit: Payer: Self-pay

## 2019-11-29 ENCOUNTER — Ambulatory Visit: Payer: HRSA Program | Attending: Critical Care Medicine | Admitting: Critical Care Medicine

## 2019-11-29 ENCOUNTER — Encounter: Payer: Self-pay | Admitting: Critical Care Medicine

## 2019-11-29 DIAGNOSIS — R945 Abnormal results of liver function studies: Secondary | ICD-10-CM | POA: Diagnosis not present

## 2019-11-29 DIAGNOSIS — T783XXD Angioneurotic edema, subsequent encounter: Secondary | ICD-10-CM

## 2019-11-29 DIAGNOSIS — D649 Anemia, unspecified: Secondary | ICD-10-CM | POA: Diagnosis not present

## 2019-11-29 DIAGNOSIS — J9601 Acute respiratory failure with hypoxia: Secondary | ICD-10-CM | POA: Diagnosis not present

## 2019-11-29 DIAGNOSIS — U071 COVID-19: Secondary | ICD-10-CM | POA: Diagnosis not present

## 2019-11-29 DIAGNOSIS — E669 Obesity, unspecified: Secondary | ICD-10-CM

## 2019-11-29 DIAGNOSIS — R7989 Other specified abnormal findings of blood chemistry: Secondary | ICD-10-CM

## 2019-11-29 DIAGNOSIS — N915 Oligomenorrhea, unspecified: Secondary | ICD-10-CM

## 2019-11-29 NOTE — Assessment & Plan Note (Signed)
History of irregular cycles improved on use of IUD

## 2019-11-29 NOTE — Progress Notes (Signed)
Subjective:    Patient ID: Melanie Hunt, female    DOB: 1986-07-30, 34 y.o.   MRN: [id number] Virtual Visit via Telephone Note  I connected with Melanie Hunt on 11/29/19 at  3:00 PM EST by telephone and verified that I am speaking with the correct person using two identifiers.   Consent:  I discussed the limitations, risks, security and privacy concerns of performing an evaluation and management service by telephone and the availability of in person appointments. I also discussed with the patient that there may be a patient responsible charge related to this service. The patient expressed understanding and agreed to proceed.  Location of patient: Patient was at home Location of provider: I was in my office  Persons participating in the televisit with the patient.   No one else on the call    History of Present Illness:   This is a telephone visit for a post hospital patient who was recently in the hospital for COVID-19 viral pneumonia  Below is a copy of the discharge summary  Admit date: 11/04/2019 Discharge date: 11/09/2019  Admitted From: Home Disposition: Home  Recommendations for Outpatient Follow-up:  1. Encourage weight loss 2. Check hemoglobin along with iron levels in 1 month   Discharge Condition: Stable CODE STATUS: Full code  Diet recommendation: Heart healthy Consultations:  None   Discharge Diagnoses:  Principal Problem:   Acute hypoxemic respiratory failure due to COVID-19 [facility]) Active Problems:   Anemia   Obesity (BMI 35.0-39.9 without comorbidity)    Brief Summary: This is a 34 year old female with a history of menorrhagia, angioedema with intubation in the past, obesity who presented to [facility] from [facility], ED with COVID-19 infection and acute hypoxic respiratory failure. Patient was started on remdesivir and steroids.  Hospital Course:  Acute hypoxic respiratory failure secondary to COVID-19 pneumonia -Initial  chest x-ray on 2/12 was consistent with multifocal pneumonia -The patient was treated with remdesivir, IV steroids and Actemra -Covid inflammatory markers are improving -She has improved clinically as well and is stable to be discharged home today  Anemia possibly secondary to menorrhagia -Hemoglobin has been in the 11-12 range and is stable  Obesity -Needs to lose weight Body mass index is 36.26 kg/m.   Since discharge this patient has markedly improved and she is nearly a month out from discharge.  She denies any cough gastrointestinal symptoms she has no headaches dizziness weakness seizures she does have excess menses and does have an IUD in place and did have mild anemia with this at discharge a month ago.  She is taking zinc and vitamin C supplements.  He does have a history of tongue edema and facial edema that was worked up and thought to be due to his menstrual cycles at [facility].  Since the IUD has been in place she has not had further angioedema.  She still has a slight amount of fatigue.  She is wishing to go back to work she works in [employer] area and states she does not need a work note at this time.  She is in need of a Pap smear  She still has a gynecologist in [place] and did have a primary care and during that she is interested in obtaining a primary care here in [place]         Review of Systems Constitutional:   No  weight loss, night sweats,  Fevers, chills, fatigue, lassitude. HEENT:   No headaches,  Difficulty swallowing,  Tooth/dental  problems,  Sore throat,                No sneezing, itching, ear ache, nasal congestion, post nasal drip,   CV:  No chest pain,  Orthopnea, PND, swelling in lower extremities, anasarca, dizziness, palpitations  GI  No heartburn, indigestion, abdominal pain, nausea, vomiting, diarrhea, change in bowel habits, loss of appetite  Resp: No shortness of breath with exertion or at rest.  No excess mucus, no productive cough,  No  non-productive cough,  No coughing up of blood.  No change in color of mucus.  No wheezing.  No chest wall deformity  Skin: no rash or lesions.  GU: no dysuria, change in color of urine, no urgency or frequency.  No flank pain.  MS:  No joint pain or swelling.  No decreased range of motion.  No back pain.  Psych:  No change in mood or affect. No depression or anxiety.  No memory loss.     Objective:   Physical Exam No exam this is a telephone note  All labs reviewed in the [place] system       Assessment & Plan:  I personally reviewed all images and lab data in the [facility] system as well as any outside material available during this office visit and agree with the  radiology impressions.   Acute hypoxemic respiratory failure due to COVID-19 [facility]) Acute hypoxic respiratory failure secondary to Covid 19 now resolved  No evidence of active viral infection she is now out of isolation and can return to work  We will be able to bring her into the office next week for lab studies also be more than a month out from discharge and I will also establish this patient in the practice for primary care  Angioedema  history of angioedema which appears to be controlled with placement of IUD  Hypomenorrhea/oligomenorrhea History of irregular cycles improved on use of IUD  Anemia Mild anemia at last blood count we will follow-up with iron studies and CBC she will come in for the labs  Abnormal LFTs History of abnormal liver function studies  Likely secondary to medications given during Covid viral illness  Plan will be to recheck liver function profile   Melanie Hunt was seen today for hospitalization follow-up.  Diagnoses and all orders for this visit:  Acute hypoxemic respiratory failure due to COVID-19 [facility]) -     Comprehensive metabolic panel; Future  Anemia, unspecified type -     CBC with Differential/Platelet; Future -     Iron; Future  Obesity (BMI 35.0-39.9 without  comorbidity)  Angioedema, subsequent encounter  Hypomenorrhea/oligomenorrhea  Abnormal LFTs -     Comprehensive metabolic panel; Future   Follow Up Instructions:   The patient knows she will be coming in for labs in the next week and that she is cleared to return to work we will obtain her an in office appointment to establish with primary care in 6 to 8 weeks I discussed the assessment and treatment plan with the patient. The patient was provided an opportunity to ask questions and all were answered. The patient agreed with the plan and demonstrated an understanding of the instructions.   The patient was advised to call back or seek an in-person evaluation if the symptoms worsen or if the condition fails to improve as anticipated.  I provided 30 minutes of non-face-to-face time during this encounter  including  median intraservice time , review of notes, labs, imaging, medications  and explaining diagnosis and management to the patient .    [name], MD

## 2019-11-29 NOTE — Assessment & Plan Note (Signed)
Mild anemia at last blood count we will follow-up with iron studies and CBC she will come in for the labs

## 2019-11-29 NOTE — Assessment & Plan Note (Signed)
History of abnormal liver function studies  Likely secondary to medications given during Covid viral illness  Plan will be to recheck liver function profile

## 2019-11-29 NOTE — Progress Notes (Signed)
Patient verified DOB Patient has eaten today. Patient has taken vitamins.

## 2019-11-29 NOTE — Assessment & Plan Note (Signed)
Acute hypoxic respiratory failure secondary to Covid 19 now resolved  No evidence of active viral infection she is now out of isolation and can return to work  We will be able to bring her into the office next week for lab studies also be more than a month out from discharge and I will also establish this patient in the practice for primary care

## 2019-11-29 NOTE — Assessment & Plan Note (Signed)
history of angioedema which appears to be controlled with placement of IUD

## 2019-12-05 ENCOUNTER — Other Ambulatory Visit: Payer: Self-pay

## 2019-12-05 ENCOUNTER — Ambulatory Visit: Payer: Self-pay | Attending: Family Medicine

## 2019-12-05 DIAGNOSIS — R945 Abnormal results of liver function studies: Secondary | ICD-10-CM

## 2019-12-05 DIAGNOSIS — J9601 Acute respiratory failure with hypoxia: Secondary | ICD-10-CM

## 2019-12-05 DIAGNOSIS — D649 Anemia, unspecified: Secondary | ICD-10-CM

## 2019-12-05 DIAGNOSIS — R7989 Other specified abnormal findings of blood chemistry: Secondary | ICD-10-CM

## 2019-12-05 DIAGNOSIS — U071 COVID-19: Secondary | ICD-10-CM

## 2019-12-06 LAB — COMPREHENSIVE METABOLIC PANEL
ALT: 48 IU/L — ABNORMAL HIGH (ref 0–32)
AST: 32 IU/L (ref 0–40)
Albumin/Globulin Ratio: 2 (ref 1.2–2.2)
Albumin: 4.5 g/dL (ref 3.8–4.8)
Alkaline Phosphatase: 120 IU/L — ABNORMAL HIGH (ref 39–117)
BUN/Creatinine Ratio: 9 (ref 9–23)
BUN: 9 mg/dL (ref 6–20)
Bilirubin Total: 0.7 mg/dL (ref 0.0–1.2)
CO2: 20 mmol/L (ref 20–29)
Calcium: 9.4 mg/dL (ref 8.7–10.2)
Chloride: 105 mmol/L (ref 96–106)
Creatinine, Ser: 0.97 mg/dL (ref 0.57–1.00)
GFR calc Af Amer: 89 mL/min/{1.73_m2} (ref >59)
GFR calc non Af Amer: 78 mL/min/{1.73_m2} (ref >59)
Globulin, Total: 2.2 g/dL (ref 1.5–4.5)
Glucose: 103 mg/dL — ABNORMAL HIGH (ref 65–99)
Potassium: 4.4 mmol/L (ref 3.5–5.2)
Sodium: 140 mmol/L (ref 134–144)
Total Protein: 6.7 g/dL (ref 6.0–8.5)

## 2019-12-06 LAB — IRON: Iron: 70 ug/dL (ref 27–159)

## 2019-12-06 LAB — CBC WITH DIFFERENTIAL/PLATELET
Basophils Absolute: 0 10*3/uL (ref 0.0–0.2)
Basos: 0 %
EOS (ABSOLUTE): 0.2 10*3/uL (ref 0.0–0.4)
Eos: 4 %
Hematocrit: 39.5 % (ref 34.0–46.6)
Hemoglobin: 13.7 g/dL (ref 11.1–15.9)
Immature Grans (Abs): 0 10*3/uL (ref 0.0–0.1)
Immature Granulocytes: 0 %
Lymphocytes Absolute: 3.6 10*3/uL — ABNORMAL HIGH (ref 0.7–3.1)
Lymphs: 55 %
MCH: 29.1 pg (ref 26.6–33.0)
MCHC: 34.7 g/dL (ref 31.5–35.7)
MCV: 84 fL (ref 79–97)
Monocytes Absolute: 0.4 10*3/uL (ref 0.1–0.9)
Monocytes: 6 %
Neutrophils Absolute: 2.3 10*3/uL (ref 1.4–7.0)
Neutrophils: 35 %
Platelets: 246 10*3/uL (ref 150–450)
RBC: 4.7 x10E6/uL (ref 3.77–5.28)
RDW: 15.9 % — ABNORMAL HIGH (ref 11.7–15.4)
WBC: 6.5 10*3/uL (ref 3.4–10.8)

## 2020-01-12 ENCOUNTER — Ambulatory Visit: Payer: Self-pay | Admitting: Family Medicine

## 2020-02-01 ENCOUNTER — Encounter: Payer: Self-pay | Admitting: Family Medicine

## 2020-02-01 ENCOUNTER — Ambulatory Visit: Payer: 59 | Attending: Family Medicine | Admitting: Family Medicine

## 2020-02-01 ENCOUNTER — Other Ambulatory Visit: Payer: Self-pay

## 2020-02-01 VITALS — BP 170/114 | HR 102 | Temp 97.9°F | Ht 67.0 in | Wt 237.8 lb

## 2020-02-01 DIAGNOSIS — K5900 Constipation, unspecified: Secondary | ICD-10-CM

## 2020-02-01 DIAGNOSIS — R5383 Other fatigue: Secondary | ICD-10-CM

## 2020-02-01 DIAGNOSIS — Z862 Personal history of diseases of the blood and blood-forming organs and certain disorders involving the immune mechanism: Secondary | ICD-10-CM

## 2020-02-01 DIAGNOSIS — I10 Essential (primary) hypertension: Secondary | ICD-10-CM

## 2020-02-01 DIAGNOSIS — R0683 Snoring: Secondary | ICD-10-CM | POA: Diagnosis not present

## 2020-02-01 DIAGNOSIS — Z6837 Body mass index (BMI) 37.0-37.9, adult: Secondary | ICD-10-CM

## 2020-02-01 MED ORDER — AMLODIPINE BESYLATE 5 MG PO TABS: 5.0000 mg | ORAL_TABLET | Freq: Every day | ORAL | 2 refills | Status: DC

## 2020-02-01 NOTE — Patient Instructions (Signed)
Hypertension, Adult Hypertension is another name for high blood pressure. High blood pressure forces your heart to work harder to pump blood. This can cause problems over time. There are two numbers in a blood pressure reading. There is a top number (systolic) over a bottom number (diastolic). It is best to have a blood pressure that is below 120/80. Healthy choices can help lower your blood pressure, or you may need medicine to help lower it. What are the causes? The cause of this condition is not known. Some conditions may be related to high blood pressure. What increases the risk?  Smoking.  Having type 2 diabetes mellitus, high cholesterol, or both.  Not getting enough exercise or physical activity.  Being overweight.  Having too much fat, sugar, calories, or salt (sodium) in your diet.  Drinking too much alcohol.  Having long-term (chronic) kidney disease.  Having a family history of high blood pressure.  Age. Risk increases with age.  Race. You may be at higher risk if you are African American.  Gender. Men are at higher risk than women before age 45. After age 65, women are at higher risk than men.  Having obstructive sleep apnea.  Stress. What are the signs or symptoms?  High blood pressure may not cause symptoms. Very high blood pressure (hypertensive crisis) may cause: ? Headache. ? Feelings of worry or nervousness (anxiety). ? Shortness of breath. ? Nosebleed. ? A feeling of being sick to your stomach (nausea). ? Throwing up (vomiting). ? Changes in how you see. ? Very bad chest pain. ? Seizures. How is this treated?  This condition is treated by making healthy lifestyle changes, such as: ? Eating healthy foods. ? Exercising more. ? Drinking less alcohol.  Your health care provider may prescribe medicine if lifestyle changes are not enough to get your blood pressure under control, and if: ? Your top number is above 130. ? Your bottom number is above  80.  Your personal target blood pressure may vary. Follow these instructions at home: Eating and drinking   If told, follow the DASH eating plan. To follow this plan: ? Fill one half of your plate at each meal with fruits and vegetables. ? Fill one fourth of your plate at each meal with whole grains. Whole grains include whole-wheat pasta, brown rice, and whole-grain bread. ? Eat or drink low-fat dairy products, such as skim milk or low-fat yogurt. ? Fill one fourth of your plate at each meal with low-fat (lean) proteins. Low-fat proteins include fish, chicken without skin, eggs, beans, and tofu. ? Avoid fatty meat, cured and processed meat, or chicken with skin. ? Avoid pre-made or processed food.  Eat less than 1,500 mg of salt each day.  Do not drink alcohol if: ? Your doctor tells you not to drink. ? You are pregnant, may be pregnant, or are planning to become pregnant.  If you drink alcohol: ? Limit how much you use to:  0-1 drink a day for women.  0-2 drinks a day for men. ? Be aware of how much alcohol is in your drink. In the [place] one drink equals one 12 oz bottle of beer (355 mL), one 5 oz glass of wine (148 mL), or one 1 oz glass of hard liquor (44 mL). Lifestyle   Work with your doctor to stay at a healthy weight or to lose weight. Ask your doctor what the best weight is for you.  Get at least 30 minutes of exercise most   days of the week. This may include walking, swimming, or biking.  Get at least 30 minutes of exercise that strengthens your muscles (resistance exercise) at least 3 days a week. This may include lifting weights or doing Pilates.  Do not use any products that contain nicotine or tobacco, such as cigarettes, e-cigarettes, and chewing tobacco. If you need help quitting, ask your doctor.  Check your blood pressure at home as told by your doctor.  Keep all follow-up visits as told by your doctor. This is important. Medicines  Take over-the-counter  and prescription medicines only as told by your doctor. Follow directions carefully.  Do not skip doses of blood pressure medicine. The medicine does not work as well if you skip doses. Skipping doses also puts you at risk for problems.  Ask your doctor about side effects or reactions to medicines that you should watch for. Contact a doctor if you:  Think you are having a reaction to the medicine you are taking.  Have headaches that keep coming back (recurring).  Feel dizzy.  Have swelling in your ankles.  Have trouble with your vision. Get help right away if you:  Get a very bad headache.  Start to feel mixed up (confused).  Feel weak or numb.  Feel faint.  Have very bad pain in your: ? Chest. ? Belly (abdomen).  Throw up more than once.  Have trouble breathing. Summary  Hypertension is another name for high blood pressure.  High blood pressure forces your heart to work harder to pump blood.  For most people, a normal blood pressure is less than 120/80.  Making healthy choices can help lower blood pressure. If your blood pressure does not get lower with healthy choices, you may need to take medicine. This information is not intended to replace advice given to you by your health care provider. Make sure you discuss any questions you have with your health care provider. Document Revised: 05/19/2018 Document Reviewed: 05/19/2018 Elsevier Patient Education  2020 [employer].  Constipation, Adult Constipation is when a person:  Poops (has a bowel movement) fewer times in a week than normal.  Has a hard time pooping.  Has poop that is dry, hard, or bigger than normal. Follow these instructions at home: Eating and drinking   Eat foods that have a lot of fiber, such as: ? Fresh fruits and vegetables. ? Whole grains. ? Beans.  Eat less of foods that are high in fat, low in fiber, or overly processed, such as: ? [place]  fries. ? Hamburgers. ? Cookies. ? Candy. ? Soda.  Drink enough fluid to keep your pee (urine) clear or pale yellow. General instructions  Exercise regularly or as told by your doctor.  Go to the restroom when you feel like you need to poop. Do not hold it in.  Take over-the-counter and prescription medicines only as told by your doctor. These include any fiber supplements.  Do pelvic floor retraining exercises, such as: ? Doing deep breathing while relaxing your lower belly (abdomen). ? Relaxing your pelvic floor while pooping.  Watch your condition for any changes.  Keep all follow-up visits as told by your doctor. This is important. Contact a doctor if:  You have pain that gets worse.  You have a fever.  You have not pooped for 4 days.  You throw up (vomit).  You are not hungry.  You lose weight.  You are bleeding from the anus.  You have thin, pencil-like poop (stool). Get  help right away if:  You have a fever, and your symptoms suddenly get worse.  You leak poop or have blood in your poop.  Your belly feels hard or bigger than normal (is bloated).  You have very bad belly pain.  You feel dizzy or you faint. This information is not intended to replace advice given to you by your health care provider. Make sure you discuss any questions you have with your health care provider. Document Revised: 08/21/2017 Document Reviewed: 02/27/2016 Elsevier Patient Education  2020 [employer].

## 2020-02-01 NOTE — Progress Notes (Signed)
Est care    Discuss BP  237.2

## 2020-02-01 NOTE — Progress Notes (Signed)
Subjective:  Patient ID: Melanie Hunt, female    DOB: 19-Mar-1986  Age: 34 y.o. MRN: [id number]  CC: Establish care  HPI Melanie Hunt, 34 year old female, new to the practice, who recently moved to the area from [place].  She did have telemedicine visit 11/29/2019 with a provider at this office in follow-up of hospital discharge.  She had hospital admission from 11/04/2019 through 11/09/2019 due to acute hypoxemic respiratory failure due to COVID-19.  She was treated at that time with remdesivir and steroids.  She reports that her blood pressure was also high during that hospitalization.  She reports that she has had elevated blood pressure in the past but has never been on blood pressure medication.  She does have some occasional frontal/temporal headaches.        She continues to have issues with fatigue.  She is not sure if this is related to her prior COVID-19 infection.  She denies any current issues with shortness of breath or cough.  No issues with loss of sense of taste or smell.  She reports that she does snore.  She does occasionally have morning headaches as well as fatigue throughout the day.  She has also had issues in the past with anemia which was thought to be related to her menses.  On review of systems, she has had onset of issues with constipation for about a year.  Dietary changes and over-the-counter medications including MiraLAX have not really helped.  She denies any blood in the stool or black stools.  No abnormal weight loss.  No change in stool size, shape or caliber.   Past Medical History:  Diagnosis Date  . Idiopathic angioedema     History reviewed. No pertinent surgical history.  Family History  Problem Relation Age of Onset  . Hypertension Mother   . Hypertension Maternal Uncle   . Hypertension Maternal Grandmother     Social History   Tobacco Use  . Smoking status: Never Smoker  . Smokeless tobacco: Never Used  Substance Use Topics  . Alcohol use: Yes   Comment: 3 to 4 times per week.    ROS Review of Systems  Constitutional: Positive for fatigue. Negative for chills and fever.  HENT: Negative for sore throat and trouble swallowing.   Eyes: Negative for photophobia and visual disturbance.  Respiratory: Negative for cough and shortness of breath.   Cardiovascular: Negative for chest pain and palpitations.  Gastrointestinal: Positive for constipation. Negative for abdominal pain, blood in stool, diarrhea and nausea.  Endocrine: Negative for cold intolerance, heat intolerance, polydipsia, polyphagia and polyuria.  Genitourinary: Negative for dysuria and frequency.  Musculoskeletal: Negative for arthralgias and back pain.  Skin: Negative for rash and wound.  Neurological: Positive for headaches (Occasional). Negative for dizziness.  Hematological: Negative for adenopathy. Does not bruise/bleed easily.  Psychiatric/Behavioral: Negative for self-injury and sleep disturbance.    Objective:   Today's Vitals: BP (!) 170/114   Pulse (!) 102   Temp 97.9 F (36.6 C) (Temporal)   Ht 5\' 7"  (1.702 m)   Wt 237 lb 12.8 oz (107.9 kg)   SpO2 99%   BMI 37.24 kg/m   Repeat BP 174/114  Physical Exam Constitutional:      General: She is not in acute distress.    Appearance: Normal appearance. She is obese. She is not ill-appearing.  HENT:     Mouth/Throat:     Comments: Slightly narrowed posterior airway secondary to body habitus and large tongue base Neck:  Vascular: No carotid bruit.  Cardiovascular:     Rate and Rhythm: Normal rate and regular rhythm.  Pulmonary:     Effort: Pulmonary effort is normal.     Breath sounds: Normal breath sounds.  Abdominal:     Palpations: Abdomen is soft.     Tenderness: There is no abdominal tenderness. There is no right CVA tenderness, left CVA tenderness, guarding or rebound.  Musculoskeletal:        General: No swelling or tenderness.     Cervical back: Normal range of motion and neck supple. No  tenderness.     Right lower leg: No edema.     Left lower leg: No edema.  Lymphadenopathy:     Cervical: No cervical adenopathy.  Skin:    General: Skin is warm and dry.  Neurological:     General: No focal deficit present.     Mental Status: She is alert and oriented to person, place, and time.  Psychiatric:        Mood and Affect: Mood normal.        Behavior: Behavior normal.     Assessment & Plan:  1. Essential hypertension; 6.Obesity BMI 37 Blood pressure is elevated at today's visit and she reports prior elevated blood pressures.  She will be started on amlodipine 5 mg once daily to help lower blood pressure and educational information on hypertension provided including obtaining a healthy weight, regular exercise and low-sodium/DASH diet.  She additionally reports fatigue and snoring and will be scheduled for sleep study as sleep apnea may also be contributing to her hypertension. - Split night study; Future - amLODipine (NORVASC) 5 MG tablet; Take 1 tablet (5 mg total) by mouth daily. To lower blood pressure  Dispense: 30 tablet; Refill: 2  2. Constipation, unspecified constipation type Patient reports recent onset of constipation that has not improved with dietary changes, increase water and over-the-counter medication use.  She will be referred to gastroenterology for further evaluation and treatment and T4/TSH will also be checked at today's visit to rule out underactive thyroid as a cause or contributing factor to her constipation. - Ambulatory referral to Gastroenterology - T4 AND TSH  3. History of anemia Review of chart, patient did have some mild anemia during her hospitalization in February for COVID-19.  She will have repeat CBC at today's visit - CBC  4. Snoring She reports snoring, daytime fatigue and occasional morning headaches and on examination patient with obesity, large tongue base and slightly narrowed posterior airway.  Sleep apnea discussed with patient  and she will be referred for sleep study.  Also discussed that her hypertension may be related to  sleep apnea - Split night study; Future  5. Fatigue, unspecified type She reports issues with fatigue and did have hospitalization in February of this year for COVID-19 infection with hypoxic respiratory failure.  She has some signs and symptoms of sleep apnea and will be referred for split-night sleep study to determine if she requires treatment.  She additionally will have a basic metabolic panel, vitamin D level, CBC and T4/TSH at today's visit to look for other causes of fatigue such as diabetes/electrolyte abnormality, vitamin D deficiency, hypothyroidism or anemia. - Split night study; Future - Basic Metabolic Panel - Vitamin D, 25-hydroxy - T4 AND TSH - CBC  Outpatient Encounter Medications as of 02/01/2020  Medication Sig  . acetaminophen (TYLENOL) 500 MG tablet Take 1,000 mg by mouth every 6 (six) hours as needed for mild pain.  Marland Kitchen  amLODipine (NORVASC) 5 MG tablet Take 1 tablet (5 mg total) by mouth daily. To lower blood pressure   No facility-administered encounter medications on file as of 02/01/2020.    An After Visit Summary was printed and given to the patient.   Follow-up: Return in about 5 weeks (around 03/07/2020) for HTN: 2-3 weeks with clinical pharmacist/Luke.    Greater than 35 minutes of face-to-face time was spent with the patient at today's visit including additional time of approximately 16 minutes for review of chart including past hospitalization and creation of today's visit note.   Antony Blackbird MD

## 2020-02-02 ENCOUNTER — Encounter: Payer: Self-pay | Admitting: Gastroenterology

## 2020-02-02 LAB — BASIC METABOLIC PANEL WITH GFR
BUN/Creatinine Ratio: 15 (ref 9–23)
BUN: 13 mg/dL (ref 6–20)
CO2: 21 mmol/L (ref 20–29)
Calcium: 9.6 mg/dL (ref 8.7–10.2)
Chloride: 101 mmol/L (ref 96–106)
Creatinine, Ser: 0.84 mg/dL (ref 0.57–1.00)
GFR calc Af Amer: 106 mL/min/1.73
GFR calc non Af Amer: 92 mL/min/1.73
Glucose: 75 mg/dL (ref 65–99)
Potassium: 4.5 mmol/L (ref 3.5–5.2)
Sodium: 137 mmol/L (ref 134–144)

## 2020-02-02 LAB — CBC
Hematocrit: 38.9 % (ref 34.0–46.6)
Hemoglobin: 13.4 g/dL (ref 11.1–15.9)
MCH: 28.6 pg (ref 26.6–33.0)
MCHC: 34.4 g/dL (ref 31.5–35.7)
MCV: 83 fL (ref 79–97)
Platelets: 302 x10E3/uL (ref 150–450)
RBC: 4.69 x10E6/uL (ref 3.77–5.28)
RDW: 14.5 % (ref 11.7–15.4)
WBC: 8.9 x10E3/uL (ref 3.4–10.8)

## 2020-02-02 LAB — T4 AND TSH
T4, Total: 7.2 ug/dL (ref 4.5–12.0)
TSH: 0.493 u[IU]/mL (ref 0.450–4.500)

## 2020-02-02 LAB — VITAMIN D 25 HYDROXY (VIT D DEFICIENCY, FRACTURES): Vit D, 25-Hydroxy: 29.9 ng/mL — ABNORMAL LOW (ref 30.0–100.0)

## 2020-02-15 ENCOUNTER — Other Ambulatory Visit: Payer: Self-pay | Admitting: Family Medicine

## 2020-02-15 ENCOUNTER — Other Ambulatory Visit: Payer: Self-pay

## 2020-02-15 ENCOUNTER — Ambulatory Visit: Payer: 59 | Attending: Family Medicine | Admitting: Pharmacist

## 2020-02-15 ENCOUNTER — Telehealth: Payer: Self-pay | Admitting: Family Medicine

## 2020-02-15 ENCOUNTER — Encounter: Payer: Self-pay | Admitting: Pharmacist

## 2020-02-15 VITALS — BP 136/87 | HR 93

## 2020-02-15 DIAGNOSIS — R5383 Other fatigue: Secondary | ICD-10-CM

## 2020-02-15 DIAGNOSIS — I10 Essential (primary) hypertension: Secondary | ICD-10-CM

## 2020-02-15 DIAGNOSIS — E669 Obesity, unspecified: Secondary | ICD-10-CM

## 2020-02-15 DIAGNOSIS — R0683 Snoring: Secondary | ICD-10-CM

## 2020-02-15 NOTE — Telephone Encounter (Signed)
Melanie Hunt, from the sleep study center called saying that they received an order for an inlab sleep study but patient insurance [employer] denied the referral but they will approve the home sleep study test. Please send a new order for the home sleep study.

## 2020-02-15 NOTE — Telephone Encounter (Signed)
New order for sleep study placed

## 2020-02-15 NOTE — Progress Notes (Addendum)
TF is a 34 YOF at 3:30 for BP      S:   Patient arrives at 3:30 on 02/15/20. Presents to the clinic for hypertension evaluation, counseling, and management. Patient was referred and last seen by Primary Care Provider on 02/01/20 by Dr. Chapman Fitch.  Medication adherence: pt reports adherence, started last Sunday    Current BP Medications include: amlodipine 5 mg daily   Antihypertensives tried in the past include: N/A  Dietary habits include: pt reports eating salty foods, but "tries to use pink Himalayan salt" Exercise habits include: none at home  Family / Social history: Mother, uncle, and grandmother (deceased) all have HTN Never smoker, reports alcohol use   ASCVD risk factors include: HTN, family history  O: Vitals:   02/15/20 1542  BP: 136/87  Pulse: 93   Home BP readings: does not check at home     Last 3 Office BP readings: 166/118, 167/107, 170/114 (all on 5/12)  BMET: on 11/07/19, glucose 123 (high), calcium 8.0 (low), albumin 3.2 (low), all other labs WNL Renal function: on 11/07/19, eGFR = >60, SCr = 0.79  Clinical ASCVD: no The ASCVD Risk score Mikey Bussing DC Jr., et al., 2013) failed to calculate for the following reasons:   The 2013 ASCVD risk score is only valid for ages 67 to 74   A/P: HTN: almost below goal, pt BP today was 136/87 with a pulse of 93. BP is slightly above goal of 130/80. Pt reported only starting her amlodipine 1 week ago, and BP has dropped significantly. More time should be allowed for medication to display full effect.  . BP: 136/87, pulse 93 . Continue current amlodipine regimen . Encouraged pt to cut back on [place] salt . Encouraged pt to continue with adherence to medications  . Have pt f/u with PCP on 03/07/20 and reassess BP   ASCVD Risk: Primary prevention in pt with HTN. Pt does not have a lipid panel in the system, and one was ordered today in order to calculate ASCVD risk score and need for statin therapy.  . Lipid panel was ordered and  pt agreed to it  . Use lipid panel and updated BP reading to calculate ASCVD risk score   Results reviewed, and written information provided.   Total time in face-to-face counseling 20 minutes.   F/U Clinic Visit on 03/07/20.    Patient seen with:  Penni Homans, PharmD Candidate  [facility]  [employer] [place], PharmD, [place] [phone]

## 2020-02-15 NOTE — Telephone Encounter (Signed)
Call [facility] and notified them new order was placed for home sleep study.

## 2020-02-15 NOTE — Progress Notes (Signed)
Patient ID: Melanie Hunt, female   DOB: [birth year], 34 y.o.   MRN: [id number]   Message received that patient's insurance company will not cover sleep study at sleep center but will cover home sleep study therefore new order will be placed for patient to have home sleep study.

## 2020-02-16 LAB — LIPID PANEL
Chol/HDL Ratio: 4.8 ratio — ABNORMAL HIGH (ref 0.0–4.4)
Cholesterol, Total: 215 mg/dL — ABNORMAL HIGH (ref 100–199)
HDL: 45 mg/dL
LDL Chol Calc (NIH): 134 mg/dL — ABNORMAL HIGH (ref 0–99)
Triglycerides: 200 mg/dL — ABNORMAL HIGH (ref 0–149)
VLDL Cholesterol Cal: 36 mg/dL (ref 5–40)

## 2020-02-22 ENCOUNTER — Ambulatory Visit: Payer: 59 | Admitting: Pharmacist

## 2020-03-03 ENCOUNTER — Inpatient Hospital Stay
Admission: RE | Admit: 2020-03-03 | Discharge: 2020-03-03 | Disposition: A | Discharge status: Home / Self Care | Payer: 59 | Source: Ambulatory Visit

## 2020-03-03 ENCOUNTER — Telehealth: Payer: Self-pay

## 2020-03-03 NOTE — Telephone Encounter (Signed)
Patient called with concerns of chills, body aches and headache after receiving her second COVID vaccine one day ago.  She is taking tylenol with some relief.  Patient was advised that these symptoms are common with the vaccine and can last up to 48 hours.  She was instructed to try ibuprofen in addition to the tylenol.  Advised to go the to ER for worsening symptoms such as severe HA, backache, shortness of breath or leg swelling/pain. Understanding of these instructions was verbalized by the patient.

## 2020-03-07 ENCOUNTER — Ambulatory Visit: Payer: 59 | Admitting: Family Medicine

## 2020-03-08 ENCOUNTER — Ambulatory Visit (INDEPENDENT_AMBULATORY_CARE_PROVIDER_SITE_OTHER): Payer: 59

## 2020-03-08 ENCOUNTER — Ambulatory Visit: Payer: 59 | Admitting: Gastroenterology

## 2020-03-08 ENCOUNTER — Encounter: Payer: Self-pay | Admitting: Gastroenterology

## 2020-03-08 VITALS — BP 140/90 | HR 92 | Ht 67.32 in | Wt 237.0 lb

## 2020-03-08 DIAGNOSIS — Z01818 Encounter for other preprocedural examination: Secondary | ICD-10-CM | POA: Diagnosis not present

## 2020-03-08 DIAGNOSIS — K5909 Other constipation: Secondary | ICD-10-CM

## 2020-03-08 DIAGNOSIS — Z1159 Encounter for screening for other viral diseases: Secondary | ICD-10-CM

## 2020-03-08 MED ORDER — NA SULFATE-K SULFATE-MG SULF 17.5-3.13-1.6 GM/177ML PO SOLN
1.0000 | Freq: Once | ORAL | 0 refills | Status: AC
Start: 2020-03-08 — End: 2020-03-08

## 2020-03-08 NOTE — Progress Notes (Signed)
[facility] Consult Note:  History: Melanie Hunt 03/08/2020  Referring provider: Antony Blackbird, MD  Reason for consult/chief complaint: Constipation   Subjective  HPI: 6 months constipation - no BM up to a week at a time.  Bloated, no abdominal or rectal pain.  No bleeding.  During that time she does not feel the need for bowel movement. No clear trigger - predates use of norvasc (added last month) and COVID hospitalization in Feb.  Nothing like this previously.  Tried fiber,probiotic, mag citrate, MiraLAX not much help.    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  Lives with her mother.   ROS:  Review of Systems  Constitutional: Negative for appetite change and unexpected weight change.  HENT: Negative for mouth sores and voice change.   Eyes: Negative for pain and redness.  Respiratory: Negative for cough and shortness of breath.   Cardiovascular: Negative for chest pain and palpitations.  Genitourinary: Negative for dysuria and hematuria.  Musculoskeletal: Negative for arthralgias and myalgias.  Skin: Negative for pallor and rash.  Neurological: Negative for weakness and headaches.  Hematological: Negative for adenopathy.     Past Medical History: Past Medical History:  Diagnosis Date  . HTN (hypertension)   . Idiopathic angioedema    Hospitalized for Covid with pneumonia in February of this year.  Past Surgical History: Past Surgical History:  Procedure Laterality Date  . UTERINE ABLATION       Family History: Family History  Problem Relation Age of Onset  . Hypertension Mother   . Hypertension Maternal Uncle   . Hypertension Maternal Grandmother     Social History: Social History   Socioeconomic History  . Marital status: Single    Spouse name: Not on file  . Number of children: 0  . Years of education: Not on file  . Highest education level: Not on file  Occupational History  .  Occupation: Proofreader  Tobacco Use  . Smoking status: Never Smoker  . Smokeless tobacco: Never Used  Vaping Use  . Vaping Use: Never used  Substance and Sexual Activity  . Alcohol use: Yes    Comment: 3 to 4 times per week.  . Drug use: Never  . Sexual activity: Yes    Birth control/protection: I.U.D.  Other Topics Concern  . Not on file  Social History Narrative  . Not on file   Social Determinants of Health   Financial Resource Strain:   . Difficulty of Paying Living Expenses:   Food Insecurity:   . Worried About Charity fundraiser in the Last Year:   . Arboriculturist in the Last Year:   Transportation Needs:   . Film/video editor (Medical):   Marland Kitchen Lack of Transportation (Non-Medical):   Physical Activity:   . Days of Exercise per Week:   . Minutes of Exercise per Session:   Stress:   . Feeling of Stress :   Social Connections:   . Frequency of Communication with Friends and Family:   . Frequency of Social Gatherings with Friends and Family:   . Attends Religious Services:   . Active Member of Clubs or Organizations:   . Attends Archivist Meetings:   Marland Kitchen Marital Status:     Allergies: No Known Allergies  Outpatient Meds: Current Outpatient Medications  Medication Sig Dispense Refill  . acetaminophen (TYLENOL) 500 MG tablet Take 1,000 mg by mouth every 6 (six) hours as  needed for mild pain.    Marland Kitchen amLODipine (NORVASC) 5 MG tablet Take 1 tablet (5 mg total) by mouth daily. To lower blood pressure 30 tablet 2  . Na Sulfate-K Sulfate-Mg Sulf 17.5-3.13-1.6 GM/177ML SOLN Take 1 kit by mouth once for 1 dose. Apply Coupon=BIN: [id number] PCN: CN GROUP: [id number] ID: [id number] 354 mL 0   No current facility-administered medications for this visit.      ___________________________________________________________________ Objective   Exam:  BP 140/90 (BP Location: Left Arm, Patient Position: Sitting, Cuff Size: Large)   Pulse 92   Ht 5' 7.32" (1.71 m)  Comment: height measured without shoes  Wt 237 lb (107.5 kg)   BMI 36.76 kg/m  Exam chaperoned by Magdalene River, MA  General: Well-appearing  Eyes: sclera anicteric, no redness  ENT: oral mucosa moist without lesions, no cervical or supraclavicular lymphadenopathy  CV: RRR without murmur, S1/S2, no JVD, no peripheral edema  Resp: clear to auscultation bilaterally, normal RR and effort noted  GI: soft, no tenderness, with active bowel sounds. No guarding or palpable organomegaly noted.  Skin; warm and dry, no rash or jaundice noted  Neuro: awake, alert and oriented x 3. Normal gross motor function and fluent speech Rectal: Normal external, normal resting sphincter tone.  Normal voluntary internal sphincter and puborectalis contraction.  Normal rectal descent and abdominal wall contraction with Valsalva.  Labs:  CBC Latest Ref Rng & Units 02/01/2020 12/05/2019 11/09/2019  WBC 3.4 - 10.8 x10E3/uL 8.9 6.5 6.4  Hemoglobin 11.1 - 15.9 g/dL 13.4 13.7 11.8(L)  Hematocrit 34.0 - 46.6 % 38.9 39.5 37.0  Platelets 150 - 450 x10E3/uL 302 246 315   CMP Latest Ref Rng & Units 02/01/2020 12/05/2019 11/09/2019  Glucose 65 - 99 mg/dL 75 103(H) 103(H)  BUN 6 - 20 mg/dL '13 9 20  ' Creatinine 0.57 - 1.00 mg/dL 0.84 0.97 0.87  Sodium 134 - 144 mmol/L 137 140 139  Potassium 3.5 - 5.2 mmol/L 4.5 4.4 4.1  Chloride 96 - 106 mmol/L 101 105 105  CO2 20 - 29 mmol/L '21 20 26  ' Calcium 8.7 - 10.2 mg/dL 9.6 9.4 8.1(L)  Total Protein 6.0 - 8.5 g/dL - 6.7 6.3(L)  Total Bilirubin 0.0 - 1.2 mg/dL - 0.7 0.5  Alkaline Phos 39 - 117 IU/L - 120(H) 70  AST 0 - 40 IU/L - 32 42(H)  ALT 0 - 32 IU/L - 48(H) 49(H)   nml TSH and Free T4 on 02/01/20  Radiologic Studies:  None  Assessment: Encounter Diagnoses  Name Primary?  . Chronic constipation Yes  . Preprocedural examination     About 6 months of chronic constipation without clear cause.  This is a definite and significant change for her, and prior to that she  would have regular bowel movement daily.  History and exam not consistent with pelvic floor dysfunction/dyssynergia defecation. Relatively young and otherwise healthy, unlikely but possible some obstructing cause.  If ruled out, would seem to be idiopathic motility disorder.  Plan:  Colonoscopy.  She was agreeable after discussion of procedure and risks.  The benefits and risks of the planned procedure were described in detail with the patient or (when appropriate) their health care proxy.  Risks were outlined as including, but not limited to, bleeding, infection, perforation, adverse medication reaction leading to cardiac or pulmonary decompensation, pancreatitis (if ERCP).  The limitation of incomplete mucosal visualization was also discussed.  No guarantees or warranties were given.  If negative, consider Zelnorm or Motegrity.  Thank you  for the courtesy of this consult.  Please call me with any questions or concerns.  Nelida Meuse III  CC: Referring provider noted above

## 2020-03-08 NOTE — Patient Instructions (Addendum)
If you are age 33 or older, your body mass index should be between 23-30. Your Body mass index is 36.76 kg/m. If this is out of the aforementioned range listed, please consider follow up with your Primary Care Provider.  If you are age 32 or younger, your body mass index should be between 19-25. Your Body mass index is 36.76 kg/m. If this is out of the aformentioned range listed, please consider follow up with your Primary Care Provider.   You have been scheduled for a colonoscopy. Please follow written instructions given to you at your visit today.  Please pick up your prep supplies at the pharmacy within the next 1-3 days. If you use inhalers (even only as needed), please bring them with you on the day of your procedure.  It was a pleasure to see you today!  Dr. Myrtie Neither

## 2020-03-09 LAB — SARS CORONAVIRUS 2 (TAT 6-24 HRS): SARS Coronavirus 2: NEGATIVE

## 2020-03-12 ENCOUNTER — Other Ambulatory Visit: Payer: Self-pay

## 2020-03-12 ENCOUNTER — Encounter: Payer: Self-pay | Admitting: Gastroenterology

## 2020-03-12 ENCOUNTER — Ambulatory Visit (AMBULATORY_SURGERY_CENTER): Payer: 59 | Admitting: Gastroenterology

## 2020-03-12 VITALS — BP 157/93 | HR 82 | Temp 97.2°F | Resp 22 | Ht 67.0 in | Wt 237.0 lb

## 2020-03-12 DIAGNOSIS — K5900 Constipation, unspecified: Secondary | ICD-10-CM

## 2020-03-12 MED ORDER — SODIUM CHLORIDE 0.9 % IV SOLN: 500.0000 mL | Freq: Once | INTRAVENOUS | Status: DC

## 2020-03-12 MED ORDER — PRUCALOPRIDE SUCCINATE 2 MG PO TABS: 2.0000 mg | ORAL_TABLET | Freq: Every day | ORAL | 0 refills | Status: DC

## 2020-03-12 NOTE — Patient Instructions (Signed)
Please read handouts provided. Continue present medications. In 2 days, begin Prucalopride 2 mg once daily with evening meal, trial dosing period. Return to GI clinic at appointment to be scheduled.     YOU HAD AN ENDOSCOPIC PROCEDURE TODAY AT THE [facility]:   Refer to the procedure report that was given to you for any specific questions about what was found during the examination.  If the procedure report does not answer your questions, please call your gastroenterologist to clarify.  If you requested that your care partner not be given the details of your procedure findings, then the procedure report has been included in a sealed envelope for you to review at your convenience later.  YOU SHOULD EXPECT: Some feelings of bloating in the abdomen. Passage of more gas than usual.  Walking can help get rid of the air that was put into your GI tract during the procedure and reduce the bloating. If you had a lower endoscopy (such as a colonoscopy or flexible sigmoidoscopy) you may notice spotting of blood in your stool or on the toilet paper. If you underwent a bowel prep for your procedure, you may not have a normal bowel movement for a few days.  Please Note:  You might notice some irritation and congestion in your nose or some drainage.  This is from the oxygen used during your procedure.  There is no need for concern and it should clear up in a day or so.  SYMPTOMS TO REPORT IMMEDIATELY:   Following lower endoscopy (colonoscopy or flexible sigmoidoscopy):  Excessive amounts of blood in the stool  Significant tenderness or worsening of abdominal pains  Swelling of the abdomen that is new, acute  Fever of 100F or higher    For urgent or emergent issues, a gastroenterologist can be reached at any hour by calling [phone]. Do not use MyChart messaging for urgent concerns.    DIET:  We do recommend a small meal at first, but then you may proceed to your regular diet.   Drink plenty of fluids but you should avoid alcoholic beverages for 24 hours.  ACTIVITY:  You should plan to take it easy for the rest of today and you should NOT DRIVE or use heavy machinery until tomorrow (because of the sedation medicines used during the test).    FOLLOW UP: Our staff will call the number listed on your records 48-72 hours following your procedure to check on you and address any questions or concerns that you may have regarding the information given to you following your procedure. If we do not reach you, we will leave a message.  We will attempt to reach you two times.  During this call, we will ask if you have developed any symptoms of COVID 19. If you develop any symptoms (ie: fever, flu-like symptoms, shortness of breath, cough etc.) before then, please call [phone].  If you test positive for Covid 19 in the 2 weeks post procedure, please call and report this information to [place].    If any biopsies were taken you will be contacted by phone or by letter within the next 1-3 weeks.  Please call us at [phone] if you have not heard about the biopsies in 3 weeks.    SIGNATURES/CONFIDENTIALITY: You and/or your care partner have signed paperwork which will be entered into your electronic medical record.  These signatures attest to the fact that that the information above on your After Visit Summary has been reviewed and  is understood.  Full responsibility of the confidentiality of this discharge information lies with you and/or your care-partner.

## 2020-03-12 NOTE — Progress Notes (Signed)
To PACU, VSS. Report to [email or web address] 

## 2020-03-12 NOTE — Op Note (Signed)
[facility] Patient Name: Deniya Craigo Procedure Date: 03/12/2020 1:18 PM MRN: [id number] Endoscopist: Sherilyn Cooter L. Myrtie Neither , MD Age: 34 Referring MD:  Date of Birth: 10-Feb-1986 Gender: Female Account #: [id number] Procedure:                Colonoscopy Indications:              Constipation (> 6 months, no clear trigger, nml                            lab, little response to multiple OTC treatments,                            Hx/exam not c/w PFD) Medicines:                Monitored Anesthesia Care Procedure:                Pre-Anesthesia Assessment:                           - Prior to the procedure, a History and Physical                            was performed, and patient medications and                            allergies were reviewed. The patient's tolerance of                            previous anesthesia was also reviewed. The risks                            and benefits of the procedure and the sedation                            options and risks were discussed with the patient.                            All questions were answered, and informed consent                            was obtained. Prior Anticoagulants: The patient has                            taken no previous anticoagulant or antiplatelet                            agents. ASA Grade Assessment: II - A patient with                            mild systemic disease. After reviewing the risks                            and benefits, the patient was deemed in  satisfactory condition to undergo the procedure.                           After obtaining informed consent, the colonoscope                            was passed under direct vision. Throughout the                            procedure, the patient's blood pressure, pulse, and                            oxygen saturations were monitored continuously. The                            Colonoscope was introduced through the anus  and                            advanced to the the cecum, identified by                            appendiceal orifice and ileocecal valve. The                            colonoscopy was performed without difficulty. The                            patient tolerated the procedure well. The quality                            of the bowel preparation was excellent. The                            ileocecal valve, appendiceal orifice, and rectum                            were photographed. The bowel preparation used was 2                            day Suprep/Miralax. Scope In: 1:36:49 PM Scope Out: 1:46:12 PM Scope Withdrawal Time: 0 hours 6 minutes 28 seconds  Total Procedure Duration: 0 hours 9 minutes 23 seconds  Findings:                 The entire examined colon appeared normal on direct                            and retroflexion views. Complications:            No immediate complications. Estimated Blood Loss:     Estimated blood loss: none. Impression:               - The entire examined colon is normal on direct and  retroflexion views.                           - No specimens collected.                           Apparent chronic idiopathic constipation. Recommendation:           - Patient has a contact number available for                            emergencies. The signs and symptoms of potential                            delayed complications were discussed with the                            patient. Return to normal activities tomorrow.                            Written discharge instructions were provided to the                            patient.                           - Resume previous diet.                           - Continue present medications.                           - No recommendation at this time regarding repeat                            colonoscopy due to young age.                           - In 2 days, begin Prucalopride 2mg   once daily with                            evening meal. Disp#14 , zero refill (trial dosing                            period)                           - Return to my office at appointment to be                            scheduled. Chisom Muntean L. Loletha Carrow, MD 03/12/2020 1:56:06 PM This report has been signed electronically.

## 2020-03-12 NOTE — Progress Notes (Signed)
Pt's states no medical or surgical changes since previsit or office visit.  JB vitals and SM IV.

## 2020-03-13 ENCOUNTER — Telehealth: Payer: Self-pay

## 2020-03-13 NOTE — Telephone Encounter (Signed)
Incoming fax from [facility]. Motegrity has been denied.  PA will be submitted with covermymeds.

## 2020-03-14 ENCOUNTER — Telehealth: Payer: Self-pay

## 2020-03-14 NOTE — Telephone Encounter (Signed)
PA for Motegrity has been denied recommending a trial and failure of Linzess.  Please advise on next step. Thank you

## 2020-03-14 NOTE — Telephone Encounter (Signed)
  Follow up Call-  Call back number 03/12/2020  Post procedure Call Back phone  # [phone]  Permission to leave phone message Yes     Patient questions:  Do you have a fever, pain , or abdominal swelling? No. Pain Score  0 *  Have you tolerated food without any problems? Yes.    Have you been able to return to your normal activities? Yes.    Do you have any questions about your discharge instructions: Diet   No. Medications  No. Follow up visit  No.  Do you have questions or concerns about your Care? No.  Actions: * If pain score is 4 or above: No action needed, pain <4. 1. Have you developed a fever since your procedure? no  2.   Have you had an respiratory symptoms (SOB or cough) since your procedure? no  3.   Have you tested positive for COVID 19 since your procedure no  4.   Have you had any family members/close contacts diagnosed with the COVID 19 since your procedure?  no   If yes to any of these questions please route to Laverna Peace, RN and Charlett Lango, RN

## 2020-03-16 NOTE — Telephone Encounter (Signed)
Please let the patient know that, and ask to come by office when she can for 4 sample boxes of Linzess 145 micrograms to take once daily.  Call our office or send portal message after a week with results and we will proceed accordingly.

## 2020-03-19 NOTE — Telephone Encounter (Signed)
Patient has been made aware of the samples at the front desk for pick up. She has not tried the linzess in the past, she will call us and let us know how she responds to treatment.

## 2020-03-21 ENCOUNTER — Ambulatory Visit: Payer: 59 | Admitting: Physician Assistant

## 2020-04-05 ENCOUNTER — Other Ambulatory Visit: Payer: Self-pay

## 2020-04-05 ENCOUNTER — Encounter: Payer: Self-pay | Admitting: Physician Assistant

## 2020-04-05 ENCOUNTER — Ambulatory Visit: Payer: 59 | Attending: Family Medicine | Admitting: Physician Assistant

## 2020-04-05 DIAGNOSIS — I10 Essential (primary) hypertension: Secondary | ICD-10-CM | POA: Diagnosis not present

## 2020-04-05 MED ORDER — AMLODIPINE BESYLATE 10 MG PO TABS: 10.0000 mg | ORAL_TABLET | Freq: Every day | ORAL | 1 refills | Status: DC

## 2020-04-05 NOTE — Progress Notes (Signed)
Virtual Visit via Telephone Note  I connected with Melanie Hunt on 04/05/20 at  4:10 PM EDT by telephone and verified that I am speaking with the correct person using two identifiers.   I discussed the limitations, risks, security and privacy concerns of performing an evaluation and management service by telephone and the availability of in person appointments. I also discussed with the patient that there may be a patient responsible charge related to this service. The patient expressed understanding and agreed to proceed.  PATIENT visit by telephone virtually in the context of Covid-19 pandemic. Patient location:  home My Location:  [facility] office Persons on the call:  Me, the patient, and [facility] CMA.    History of Present Illness: Saw Dr Jillyn Hidden 02/01/2020 and started on 5mg  amlodipine.  BP at last 3 [facility] appts: 6/21=157/93 6/17=140/90 5/27=136/87 She is not checking BP at home or OOO.  She is tolerating amlodipine 5mg  without any issues.      Observations/Objective: NAD.     Assessment and Plan: 1. Essential hypertension Not at goal.  Increase dose.  Check BP OOO and reocrd at least once weekly - amLODipine (NORVASC) 10 MG tablet; Take 1 tablet (10 mg total) by mouth daily.  Dispense: 90 tablet; Refill: 1    Follow Up Instructions: PCP in 2-3 months   I discussed the assessment and treatment plan with the patient. The patient was provided an opportunity to ask questions and all were answered. The patient agreed with the plan and demonstrated an understanding of the instructions.   The patient was advised to call back or seek an in-person evaluation if the symptoms worsen or if the condition fails to improve as anticipated.  I provided 11 minutes of non-face-to-face time during this encounter.   04-30-1971, PA-C  Patient ID: Melanie Hunt, female   DOB: [birth year], 34 y.o.   MRN: 03/30/1986

## 2020-04-23 ENCOUNTER — Ambulatory Visit (HOSPITAL_BASED_OUTPATIENT_CLINIC_OR_DEPARTMENT_OTHER): Payer: 59 | Attending: Family Medicine | Admitting: Cardiology

## 2020-04-23 DIAGNOSIS — E669 Obesity, unspecified: Secondary | ICD-10-CM | POA: Diagnosis not present

## 2020-04-23 DIAGNOSIS — R5383 Other fatigue: Secondary | ICD-10-CM

## 2020-04-23 DIAGNOSIS — Z6837 Body mass index (BMI) 37.0-37.9, adult: Secondary | ICD-10-CM | POA: Insufficient documentation

## 2020-04-23 DIAGNOSIS — R0683 Snoring: Secondary | ICD-10-CM

## 2020-04-24 ENCOUNTER — Other Ambulatory Visit: Payer: Self-pay

## 2020-04-25 ENCOUNTER — Telehealth: Payer: Self-pay | Admitting: *Deleted

## 2020-04-25 NOTE — Telephone Encounter (Signed)
-----   Message from Quintella Reichert, MD sent at 04/25/2020  2:54 PM EDT ----- Please let patient know that sleep study showed no significant sleep apnea.

## 2020-04-25 NOTE — Procedures (Signed)
   Patient Name: Melanie, Hunt Date: 04/23/2020 Gender: Female D.O.B: 1986-09-01 Age (years): 34 Referring Provider: Cammie Fulp Height (inches): 67 Interpreting Physician: Armanda Magic MD, ABSM Weight (lbs): 237 RPSGT: Bolan Sink BMI: 37 MRN: [id number] Neck Size: 14.50  CLINICAL INFORMATION Sleep Study Type: HST  Indication for sleep study: N/A  Epworth Sleepiness Score: 21  SLEEP STUDY TECHNIQUE A multi-channel overnight portable sleep study was performed. The channels recorded were: nasal airflow, thoracic respiratory movement, and oxygen saturation with a pulse oximetry. Snoring was also monitored.  MEDICATIONS Patient self administered medications include: N/A.  SLEEP ARCHITECTURE Patient was studied for 434.8 minutes. The sleep efficiency was 100.0 % and the patient was supine for 0%. The arousal index was 0.0 per hour.  RESPIRATORY PARAMETERS The overall AHI was 2.8 per hour, with a central apnea index of 0.0 per hour.  The oxygen nadir was 86% during sleep.  CARDIAC DATA Mean heart rate during sleep was 85.0 bpm.  IMPRESSIONS - No significant obstructive sleep apnea occurred during this study (AHI = 2.8/h). - No significant central sleep apnea occurred during this study (CAI = 0.0/h). - Moderate oxygen desaturation was noted during this study (Min O2 = 86%). - Patient snored 10.9% during the sleep.  DIAGNOSIS - Normal study  RECOMMENDATIONS - Avoid alcohol, sedatives and other CNS depressants that may worsen sleep apnea and disrupt normal sleep architecture. - Sleep hygiene should be reviewed to assess factors that may improve sleep quality. - Weight management and regular exercise should be initiated or continued.=  [Electronically signed] 04/25/2020 02:52 PM  Armanda Magic MD, ABSM Diplomate, American Board of Sleep Medicine

## 2020-04-25 NOTE — Telephone Encounter (Signed)
Informed patient of sleep study results and patient understanding was verbalized. Patient understands her sleep study showed no significant sleep apnea.   Pt is aware and agreeable to normal results.  

## 2020-05-04 ENCOUNTER — Ambulatory Visit: Payer: 59 | Admitting: Gastroenterology

## 2020-08-07 ENCOUNTER — Encounter: Payer: Self-pay | Admitting: Family Medicine

## 2020-08-15 ENCOUNTER — Ambulatory Visit: Payer: Self-pay | Admitting: Family Medicine

## 2020-08-18 NOTE — Progress Notes (Signed)
Subjective:    Patient ID: Melanie Hunt, female    DOB: 06-25-1986, 34 y.o.   MRN: [id number]  History of Present Illness:   12/19/19 This is a telephone visit for a post hospital patient who was recently in the hospital for COVID-19 viral pneumonia  Below is a copy of the discharge summary  Admit date: 11/04/2019 Discharge date: 11/09/2019  Admitted From: Home Disposition: Home  Recommendations for Outpatient Follow-up:  1. Encourage weight loss 2. Check hemoglobin along with iron levels in 1 month   Discharge Condition: Stable CODE STATUS: Full code  Diet recommendation: Heart healthy Consultations:  None   Discharge Diagnoses:  Principal Problem:   Acute hypoxemic respiratory failure due to COVID-19 [facility]) Active Problems:   Anemia   Obesity (BMI 35.0-39.9 without comorbidity)    Brief Summary: This is a 34 year old female with a history of menorrhagia, angioedema with intubation in the past, obesity who presented to [facility] from [facility], ED with COVID-19 infection and acute hypoxic respiratory failure. Patient was started on remdesivir and steroids.  Hospital Course:  Acute hypoxic respiratory failure secondary to COVID-19 pneumonia -Initial chest x-ray on 2/12 was consistent with multifocal pneumonia -The patient was treated with remdesivir, IV steroids and Actemra -Covid inflammatory markers are improving -She has improved clinically as well and is stable to be discharged home today  Anemia possibly secondary to menorrhagia -Hemoglobin has been in the 11-12 range and is stable  Obesity -Needs to lose weight Body mass index is 36.26 kg/m.   Since discharge this patient has markedly improved and she is nearly a month out from discharge.  She denies any cough gastrointestinal symptoms she has no headaches dizziness weakness seizures she does have excess menses and does have an IUD in place and did have mild anemia with this at  discharge a month ago.  She is taking zinc and vitamin C supplements.  He does have a history of tongue edema and facial edema that was worked up and thought to be due to his menstrual cycles at [facility].  Since the IUD has been in place she has not had further angioedema.  She still has a slight amount of fatigue.  She is wishing to go back to work she works in [employer] area and states she does not need a work note at this time.  She is in need of a Pap smear  She still has a gynecologist in [place] and did have a primary care and during that she is interested in obtaining a primary care here in [facility]  11/29: This patient was last seen by me in March 2021 and by Dr. Jillyn Hidden in May 2021 she is now here to reestablish primary care.  Patient has a prior history of COVID-19 pneumonia with respiratory failure in February.  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  Patient states she has minimal cough at this time.  Note she does have an IUD as directed in place and is in need of a Pap smear.  She did have a sleep study done this year which showed no significant sleep apnea.  Patient's blood pressure on arrival is 142/89 she has been compliant with her blood pressure medicines.  Patient's weight remains elevated with a BMI of 39 and she is not been adherent with her diet.  She does complain of some mild reflux symptoms  at this time.  Repeat blood counts at the last visit were normal so her anemia has resolved.  Thyroid function was normal as well.  Patient's fatigue has resolved as well Vitamin D levels were normal  Past Medical History:  Diagnosis Date  . Acute hypoxemic respiratory failure due to COVID-19 (HCC) 11/04/2019  . HTN (hypertension)   . Idiopathic angioedema      Family History  Problem Relation Age of Onset  . Hypertension Mother   . Hypertension Maternal Uncle   . Hypertension Maternal  Grandmother   . Esophageal cancer Neg Hx   . Colon cancer Neg Hx   . Rectal cancer Neg Hx   . Stomach cancer Neg Hx      Social History   Socioeconomic History  . Marital status: Single    Spouse name: Not on file  . Number of children: 0  . Years of education: Not on file  . Highest education level: Not on file  Occupational History  . Occupation: Naval architect  Tobacco Use  . Smoking status: Never Smoker  . Smokeless tobacco: Never Used  Vaping Use  . Vaping Use: Never used  Substance and Sexual Activity  . Alcohol use: Yes    Comment: 3 to 4 times per week.  . Drug use: Never  . Sexual activity: Yes    Birth control/protection: I.U.D.  Other Topics Concern  . Not on file  Social History Narrative  . Not on file   Social Determinants of Health   Financial Resource Strain:   . Difficulty of Paying Living Expenses: Not on file  Food Insecurity:   . Worried About Programme researcher, broadcasting/film/video in the Last Year: Not on file  . Ran Out of Food in the Last Year: Not on file  Transportation Needs:   . Lack of Transportation (Medical): Not on file  . Lack of Transportation (Non-Medical): Not on file  Physical Activity:   . Days of Exercise per Week: Not on file  . Minutes of Exercise per Session: Not on file  Stress:   . Feeling of Stress : Not on file  Social Connections:   . Frequency of Communication with Friends and Family: Not on file  . Frequency of Social Gatherings with Friends and Family: Not on file  . Attends Religious Services: Not on file  . Active Member of Clubs or Organizations: Not on file  . Attends Banker Meetings: Not on file  . Marital Status: Not on file  Intimate Partner Violence:   . Fear of Current or Ex-Partner: Not on file  . Emotionally Abused: Not on file  . Physically Abused: Not on file  . Sexually Abused: Not on file     No Known Allergies   Outpatient Medications Prior to Visit  Medication Sig Dispense Refill  . acetaminophen  (TYLENOL) 500 MG tablet Take 1,000 mg by mouth every 6 (six) hours as needed for mild pain.    Marland Kitchen amLODipine (NORVASC) 10 MG tablet Take 1 tablet (10 mg total) by mouth daily. 90 tablet 1   No facility-administered medications prior to visit.      Review of Systems  HENT: Positive for sinus pressure and sinus pain. Negative for congestion, ear discharge, ear pain and sore throat.        Dry scratchy throat  Respiratory: Positive for cough, chest tightness, shortness of breath and wheezing.        Cough is dry  Gastrointestinal:  GERD   Genitourinary: Negative.   Neurological: Positive for headaches. Negative for seizures.  Psychiatric/Behavioral: Negative for dysphoric mood, self-injury and suicidal ideas. The patient is not nervous/anxious.    C   Objective:   Physical Exam  Vitals:   08/20/20 0937  BP: (!) 142/89  Pulse: 87  SpO2: 97%  Weight: 250 lb 6.4 oz (113.6 kg)    Gen: Pleasant, obese, in no distress,  normal affect  ENT: No lesions,  mouth clear,  oropharynx clear, no postnasal drip  Neck: No JVD, no TMG, no carotid bruits  Lungs: No use of accessory muscles, no dullness to percussion, clear without rales or rhonchi  Cardiovascular: RRR, heart sounds normal, no murmur or gallops, no peripheral edema  Abdomen: soft and NT, no HSM,  BS normal  Musculoskeletal: No deformities, no cyanosis or clubbing  Neuro: alert, non focal  Skin: Warm, no lesions or rashes        Assessment & Plan:  I personally reviewed all images and lab data in the [facility] system as well as any outside material available during this office visit and agree with the  radiology impressions.   Acute hypoxemic respiratory failure due to COVID-19 [facility]) COVID-19 with respiratory failure has resolved  Essential hypertension Adequate control we will renew amlodipine  Abnormal LFTs This has resolved  Anemia Resolved  Metrorrhagia This is resolved with placement of IUD she does  need a Pap smear and follow-up with gynecology  Referral to Dr. Earlene Plater of gynecology for follow-up of IUD and Pap smear  Obesity (BMI 35.0-39.9 without comorbidity) Dietary recommendations given   Melanie Hunt was seen today for follow-up.  Diagnoses and all orders for this visit:  Need for hepatitis C screening test -     HCV Ab w/Rflx to Verification  Essential hypertension -     amLODipine (NORVASC) 10 MG tablet; Take 1 tablet (10 mg total) by mouth daily.  Encounter for screening for HIV -     HIV Antibody (routine testing w rflx)  Need for immunization against influenza -     Flu Vaccine QUAD 36+ mos IM  Acute hypoxemic respiratory failure due to COVID-19 (HCC)  Abnormal LFTs  Anemia, unspecified type  Metrorrhagia  IUD (intrauterine device) in place  Obesity (BMI 35.0-39.9 without comorbidity)  Other orders -     famotidine (PEPCID) 20 MG tablet; Take 1 tablet (20 mg total) by mouth daily. -     fluticasone (FLONASE) 50 MCG/ACT nasal spray; Place 2 sprays into both nostrils daily. -     benzonatate (TESSALON) 100 MG capsule; Take 1 capsule (100 mg total) by mouth 2 (two) times daily as needed for cough.   Flu vaccine given at this visit

## 2020-08-20 ENCOUNTER — Other Ambulatory Visit: Payer: Self-pay

## 2020-08-20 ENCOUNTER — Ambulatory Visit: Payer: Self-pay | Attending: Family Medicine | Admitting: Critical Care Medicine

## 2020-08-20 ENCOUNTER — Encounter: Payer: Self-pay | Admitting: Critical Care Medicine

## 2020-08-20 VITALS — BP 142/89 | HR 87 | Wt 250.4 lb

## 2020-08-20 DIAGNOSIS — D649 Anemia, unspecified: Secondary | ICD-10-CM

## 2020-08-20 DIAGNOSIS — Z114 Encounter for screening for human immunodeficiency virus [HIV]: Secondary | ICD-10-CM | POA: Diagnosis not present

## 2020-08-20 DIAGNOSIS — R945 Abnormal results of liver function studies: Secondary | ICD-10-CM

## 2020-08-20 DIAGNOSIS — J9601 Acute respiratory failure with hypoxia: Secondary | ICD-10-CM

## 2020-08-20 DIAGNOSIS — I10 Essential (primary) hypertension: Secondary | ICD-10-CM

## 2020-08-20 DIAGNOSIS — Z23 Encounter for immunization: Secondary | ICD-10-CM

## 2020-08-20 DIAGNOSIS — E669 Obesity, unspecified: Secondary | ICD-10-CM

## 2020-08-20 DIAGNOSIS — U071 COVID-19: Secondary | ICD-10-CM

## 2020-08-20 DIAGNOSIS — N921 Excessive and frequent menstruation with irregular cycle: Secondary | ICD-10-CM

## 2020-08-20 DIAGNOSIS — Z1159 Encounter for screening for other viral diseases: Secondary | ICD-10-CM

## 2020-08-20 DIAGNOSIS — Z975 Presence of (intrauterine) contraceptive device: Secondary | ICD-10-CM

## 2020-08-20 DIAGNOSIS — R7989 Other specified abnormal findings of blood chemistry: Secondary | ICD-10-CM

## 2020-08-20 MED ORDER — AMLODIPINE BESYLATE 10 MG PO TABS: 10.0000 mg | ORAL_TABLET | Freq: Every day | ORAL | 1 refills | Status: DC

## 2020-08-20 MED ORDER — FAMOTIDINE 20 MG PO TABS: 20.0000 mg | ORAL_TABLET | Freq: Every day | ORAL | 6 refills | Status: DC

## 2020-08-20 MED ORDER — BENZONATATE 100 MG PO CAPS: 100.0000 mg | ORAL_CAPSULE | Freq: Two times a day (BID) | ORAL | 0 refills | Status: DC | PRN

## 2020-08-20 MED ORDER — FLUTICASONE PROPIONATE 50 MCG/ACT NA SUSP
2.0000 | Freq: Every day | NASAL | 6 refills | Status: DC
Start: 2020-08-20 — End: 2023-01-10

## 2020-08-20 NOTE — Assessment & Plan Note (Signed)
This is resolved with placement of IUD she does need a Pap smear and follow-up with gynecology  Referral to Dr. Earlene Plater of gynecology for follow-up of IUD and Pap smear

## 2020-08-20 NOTE — Assessment & Plan Note (Signed)
Dietary recommendations given 

## 2020-08-20 NOTE — Assessment & Plan Note (Signed)
Resolved

## 2020-08-20 NOTE — Patient Instructions (Signed)
A flu vaccine was given  Labs today include HIV and hepatitis C screen  Please keep a diary of your blood pressures and we will have a video visit in 2 weeks via MyChart to see what your results are  Stay on amlodipine daily refill sent [facility] your [facility]  Begin Flonase 2 sprays each nostril daily, Pepcid 20 mg daily, benzonatate as needed for cough, all sent to your [facility]  A referral to [facility] for gynecology was made for your Pap smear and evaluation of your IUD status  Referral to our clinical social worker Jenel Lucks will be made for depression counseling  A letter is given documenting the reason for the Tussionex prescription in February for your employer  Return to see Dr. Delford Field as noted in 3 weeks with a video visit  Follow diet below   Food Choices for Gastroesophageal Reflux Disease, Adult When you have gastroesophageal reflux disease (GERD), the foods you eat and your eating habits are very important. Choosing the right foods can help ease the discomfort of GERD. Consider working with a diet and nutrition specialist (dietitian) to help you make healthy food choices. What general guidelines should I follow?  Eating plan  Choose healthy foods low in fat, such as fruits, vegetables, whole grains, low-fat dairy products, and lean meat, fish, and poultry.  Eat frequent, small meals instead of three large meals each day. Eat your meals slowly, in a relaxed setting. Avoid bending over or lying down until 2-3 hours after eating.  Limit high-fat foods such as fatty meats or fried foods.  Limit your intake of oils, butter, and shortening to less than 8 teaspoons each day.  Avoid the following: ? Foods that cause symptoms. These may be different for different people. Keep a food diary to keep track of foods that cause symptoms. ? Alcohol. ? Drinking large amounts of liquid with meals. ? Eating meals during the 2-3 hours before bed.  Cook foods using  methods other than frying. This may include baking, grilling, or broiling. Lifestyle  Maintain a healthy weight. Ask your health care provider what weight is healthy for you. If you need to lose weight, work with your health care provider to do so safely.  Exercise for at least 30 minutes on 5 or more days each week, or as told by your health care provider.  Avoid wearing clothes that fit tightly around your waist and chest.  Do not use any products that contain nicotine or tobacco, such as cigarettes and e-cigarettes. If you need help quitting, ask your health care provider.  Sleep with the head of your bed raised. Use a wedge under the mattress or blocks under the bed frame to raise the head of the bed. What foods are not recommended? The items listed may not be a complete list. Talk with your dietitian about what dietary choices are best for you. Grains Pastries or quick breads with added fat. [place] toast. Vegetables Deep fried vegetables. [place] fries. Any vegetables prepared with added fat. Any vegetables that cause symptoms. For some people this may include tomatoes and tomato products, chili peppers, onions and garlic, and horseradish. Fruits Any fruits prepared with added fat. Any fruits that cause symptoms. For some people this may include citrus fruits, such as oranges, grapefruit, pineapple, and lemons. Meats and other protein foods High-fat meats, such as fatty beef or pork, hot dogs, ribs, ham, sausage, salami and bacon. Fried meat or protein, including fried fish and fried  chicken. Nuts and nut butters. Dairy Whole milk and chocolate milk. Sour cream. Cream. Ice cream. Cream cheese. Milk shakes. Beverages Coffee and tea, with or without caffeine. Carbonated beverages. Sodas. Energy drinks. Fruit juice made with acidic fruits (such as orange or grapefruit). Tomato juice. Alcoholic drinks. Fats and oils Butter. Margarine. Shortening. Ghee. Sweets and desserts Chocolate and  cocoa. Donuts. Seasoning and other foods Pepper. Peppermint and spearmint. Any condiments, herbs, or seasonings that cause symptoms. For some people, this may include curry, hot sauce, or vinegar-based salad dressings. Summary  When you have gastroesophageal reflux disease (GERD), food and lifestyle choices are very important to help ease the discomfort of GERD.  Eat frequent, small meals instead of three large meals each day. Eat your meals slowly, in a relaxed setting. Avoid bending over or lying down until 2-3 hours after eating.  Limit high-fat foods such as fatty meat or fried foods. This information is not intended to replace advice given to you by your health care provider. Make sure you discuss any questions you have with your health care provider. Document Revised: 12/30/2018 Document Reviewed: 09/09/2016 Elsevier Patient Education  2020 [employer].

## 2020-08-20 NOTE — Progress Notes (Signed)
Needs a letter stating that she was prescribed Tussinex back in Feb due to failed drug test

## 2020-08-20 NOTE — Assessment & Plan Note (Signed)
COVID-19 with respiratory failure has resolved

## 2020-08-20 NOTE — Assessment & Plan Note (Signed)
Adequate control we will renew amlodipine

## 2020-08-20 NOTE — Assessment & Plan Note (Signed)
This has resolved.

## 2020-08-21 LAB — HCV INTERPRETATION

## 2020-08-21 LAB — HCV AB W/RFLX TO VERIFICATION: HCV Ab: <0.1 s/co ratio (ref 0.0–0.9)

## 2020-08-21 LAB — HIV ANTIBODY (ROUTINE TESTING W REFLEX): HIV Screen 4th Generation wRfx: NONREACTIVE

## 2020-08-24 ENCOUNTER — Ambulatory Visit: Payer: Self-pay | Admitting: Family

## 2021-02-28 ENCOUNTER — Emergency Department
Admission: EM | Admit: 2021-02-28 | Discharge: 2021-02-28 | Disposition: A | Discharge status: Home / Self Care | Payer: No Typology Code available for payment source | Attending: Emergency Medicine | Admitting: Emergency Medicine

## 2021-02-28 ENCOUNTER — Other Ambulatory Visit: Payer: Self-pay

## 2021-02-28 ENCOUNTER — Emergency Department: Payer: No Typology Code available for payment source

## 2021-02-28 DIAGNOSIS — S5002XA Contusion of left elbow, initial encounter: Secondary | ICD-10-CM | POA: Diagnosis not present

## 2021-02-28 DIAGNOSIS — Z79899 Other long term (current) drug therapy: Secondary | ICD-10-CM | POA: Diagnosis not present

## 2021-02-28 DIAGNOSIS — Y92410 Unspecified street and highway as the place of occurrence of the external cause: Secondary | ICD-10-CM | POA: Diagnosis not present

## 2021-02-28 DIAGNOSIS — Z8616 Personal history of COVID-19: Secondary | ICD-10-CM | POA: Diagnosis not present

## 2021-02-28 DIAGNOSIS — S161XXA Strain of muscle, fascia and tendon at neck level, initial encounter: Secondary | ICD-10-CM | POA: Insufficient documentation

## 2021-02-28 DIAGNOSIS — S199XXA Unspecified injury of neck, initial encounter: Secondary | ICD-10-CM | POA: Diagnosis present

## 2021-02-28 DIAGNOSIS — S39012A Strain of muscle, fascia and tendon of lower back, initial encounter: Secondary | ICD-10-CM | POA: Diagnosis not present

## 2021-02-28 DIAGNOSIS — I10 Essential (primary) hypertension: Secondary | ICD-10-CM | POA: Insufficient documentation

## 2021-02-28 DIAGNOSIS — S50312A Abrasion of left elbow, initial encounter: Secondary | ICD-10-CM

## 2021-02-28 MED ORDER — MELOXICAM 15 MG PO TABS: 15.0000 mg | ORAL_TABLET | Freq: Every day | ORAL | 0 refills | Status: DC

## 2021-02-28 MED ORDER — MELOXICAM 7.5 MG PO TABS
15.0000 mg | ORAL_TABLET | Freq: Once | ORAL | Status: AC
  Administered 2021-02-28: 15 mg via ORAL
  Filled 2021-02-28: qty 2

## 2021-02-28 MED ORDER — METHOCARBAMOL 500 MG PO TABS: 500.0000 mg | ORAL_TABLET | Freq: Four times a day (QID) | ORAL | 0 refills | Status: DC

## 2021-02-28 MED ORDER — METHOCARBAMOL 500 MG PO TABS
1000.0000 mg | ORAL_TABLET | Freq: Once | ORAL | Status: AC
  Administered 2021-02-28: 1000 mg via ORAL
  Filled 2021-02-28: qty 2

## 2021-02-28 MED ORDER — HYDROCODONE-ACETAMINOPHEN 5-325 MG PO TABS
1.0000 | ORAL_TABLET | Freq: Once | ORAL | Status: AC
  Administered 2021-02-28: 1 via ORAL
  Filled 2021-02-28: qty 1
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
## 2021-02-28 NOTE — ED Notes (Signed)
Patient transported to X-ray 

## 2021-08-30 IMAGING — DX DG CHEST 1V PORT
1 series · 1 of 1 positions shown · non-contrast
Comparison: November 04, 2019 study obtained earlier in the day

CLINICAL DATA: Cough and shortness of breath.  Fever.

EXAM:
PORTABLE CHEST 1 VIEW

[chest ap]
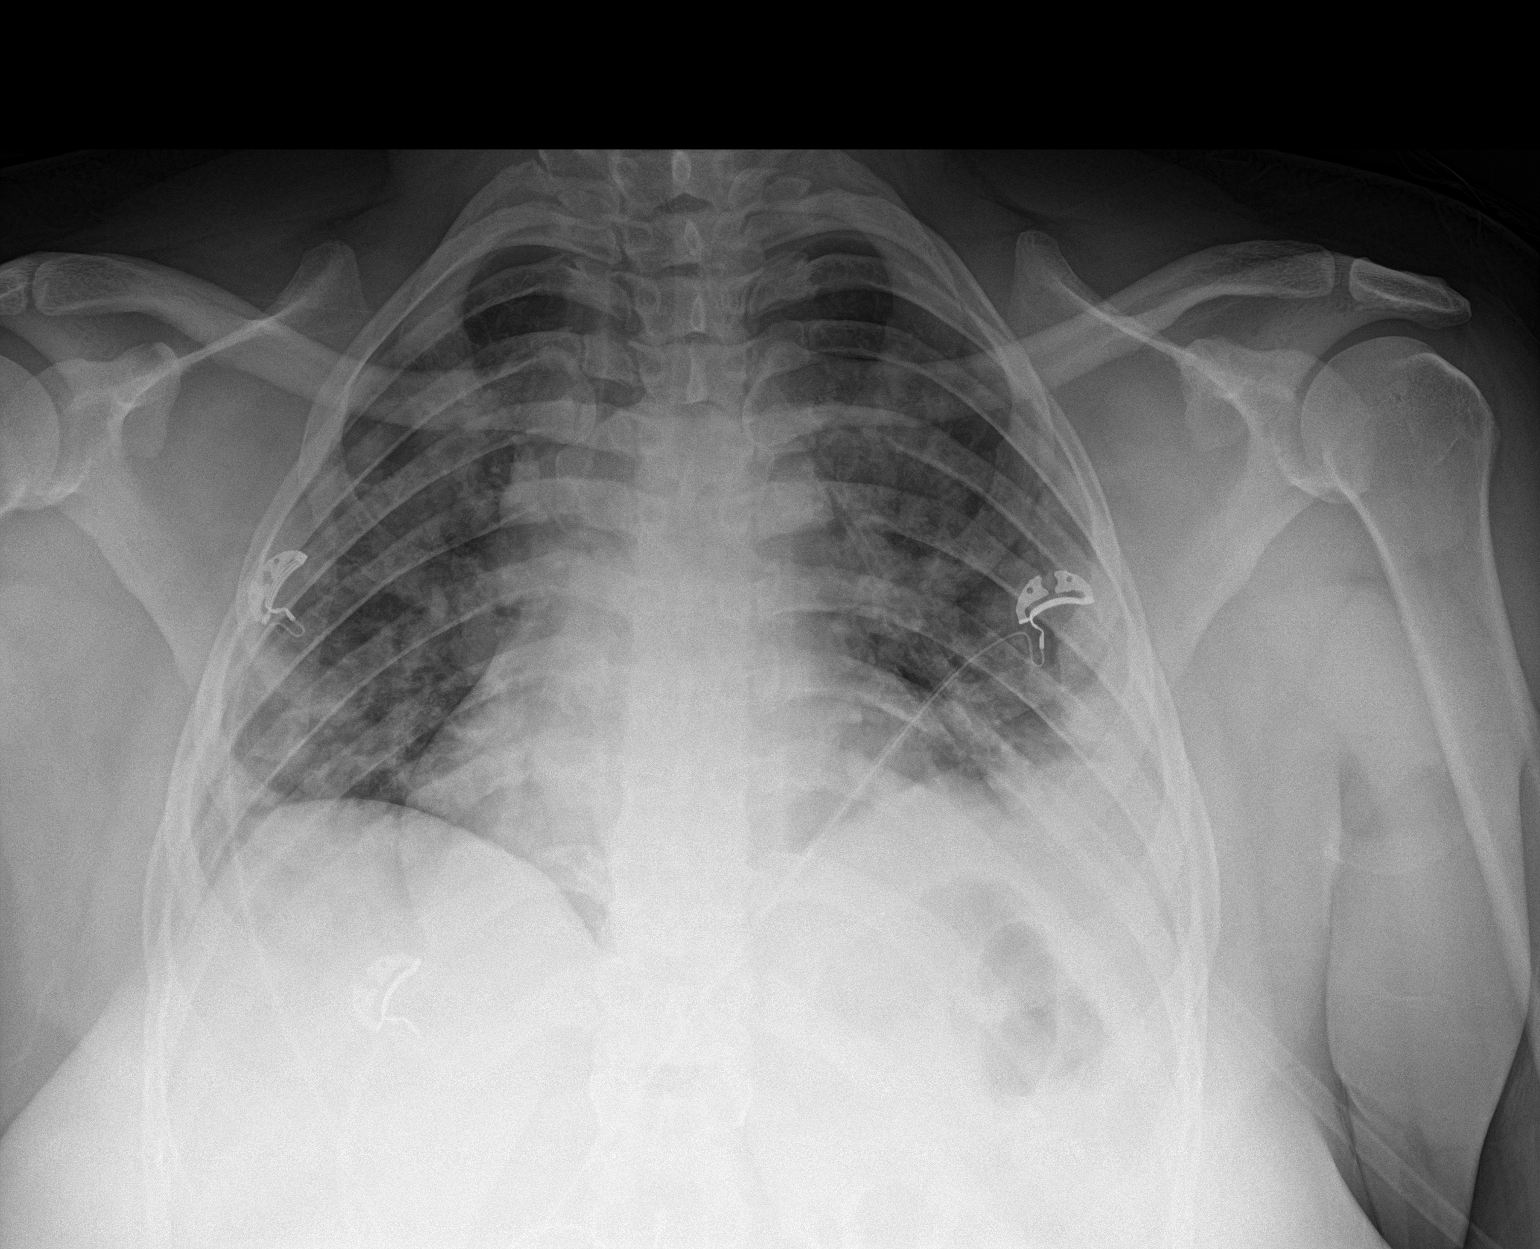

[1 of 1 positions shown; findings below may reference images not displayed]

FINDINGS: There is patchy airspace opacity bilaterally with small left pleural
effusion. Heart is upper normal in size with pulmonary vascularity
normal. No adenopathy. No bone lesions.
IMPRESSION: Multifocal pneumonia with small left pleural effusion, stable. No
new opacity compared to earlier in the day. Stable cardiac
silhouette. No adenopathy evident.

## 2021-09-01 IMAGING — DX DG CHEST 1V PORT
1 series · 1 of 1 positions shown · non-contrast
Comparison: Chest radiograph 11/04/2019

CLINICAL DATA: Pneumonia.

EXAM:
PORTABLE CHEST 1 VIEW

[chest]
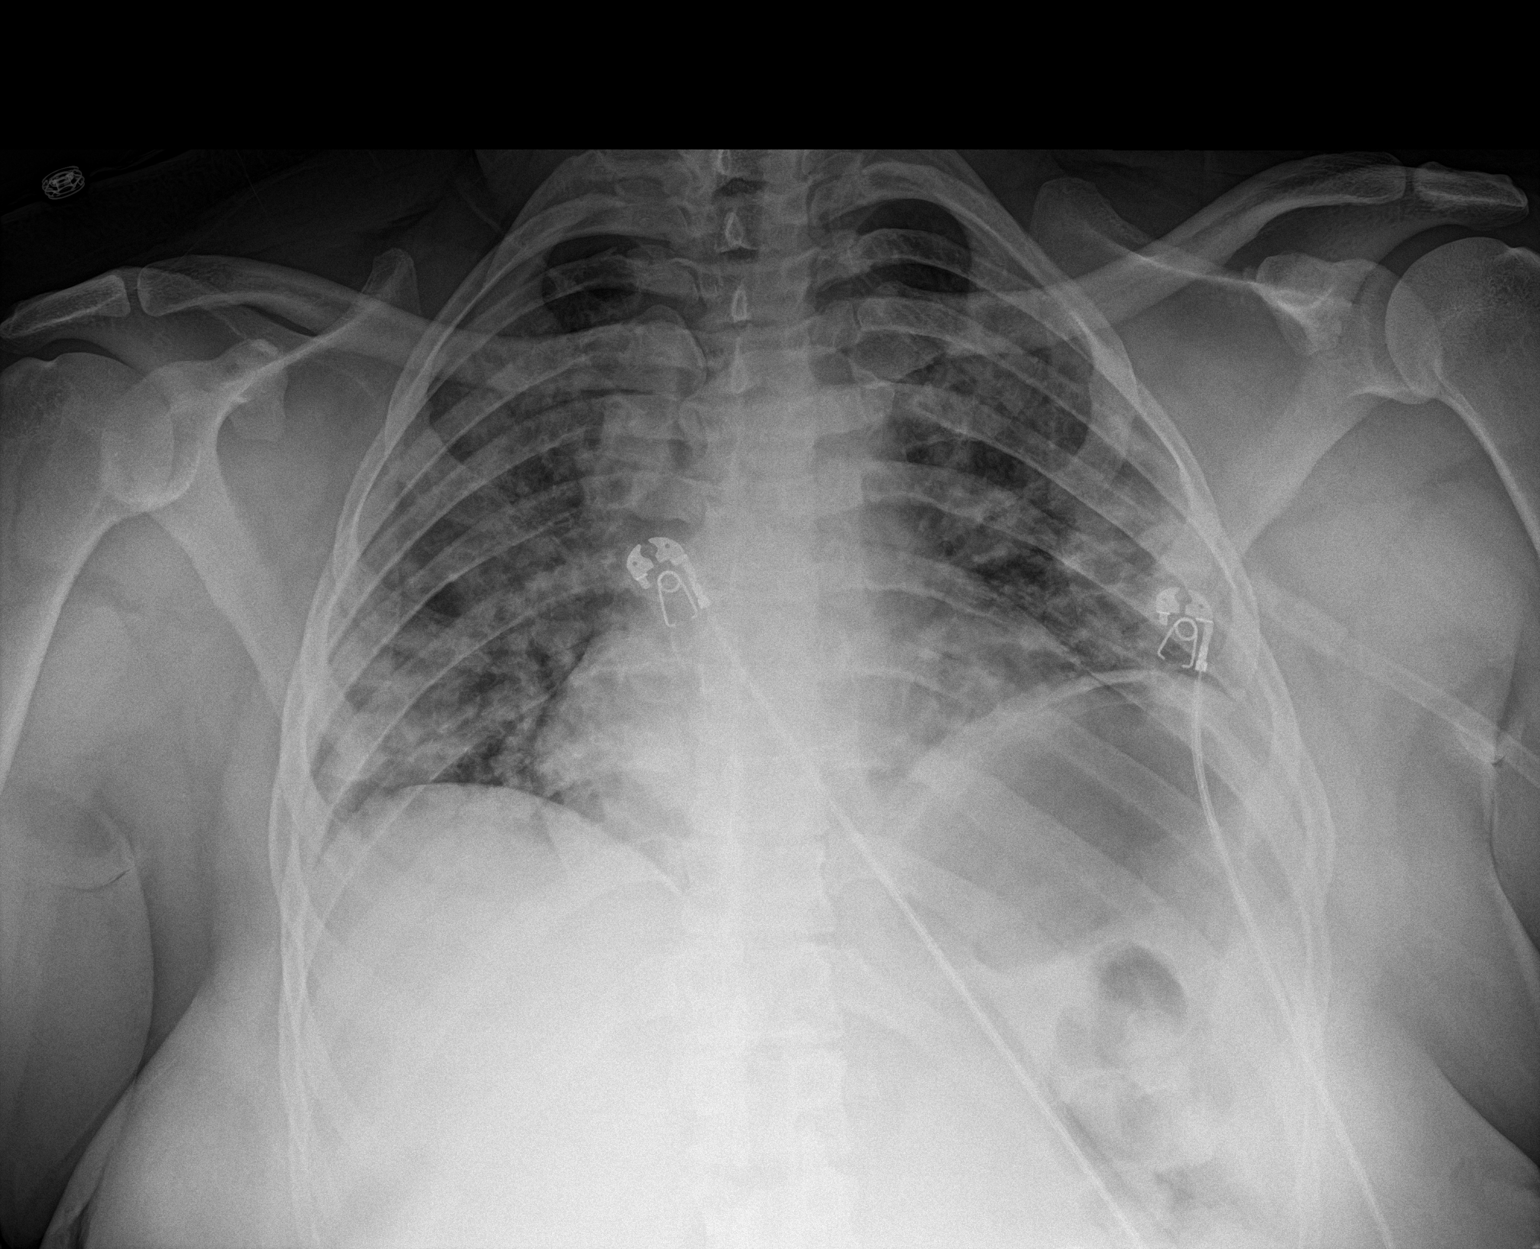

[1 of 1 positions shown; findings below may reference images not displayed]

FINDINGS: Stable cardiomediastinal contours. Low lung volumes. There are
persistent diffuse bilateral airspace opacities. No pneumothorax or
large pleural effusion. No acute finding in the visualized skeleton.
IMPRESSION: Stable chest with persistent diffuse bilateral airspace opacities.

## 2021-09-02 IMAGING — DX DG CHEST 1V PORT
1 series · 1 of 1 positions shown · non-contrast
Comparison: 11/06/2019.

CLINICAL DATA: [facility] pneumonia.

EXAM:
PORTABLE CHEST 1 VIEW

[chest]
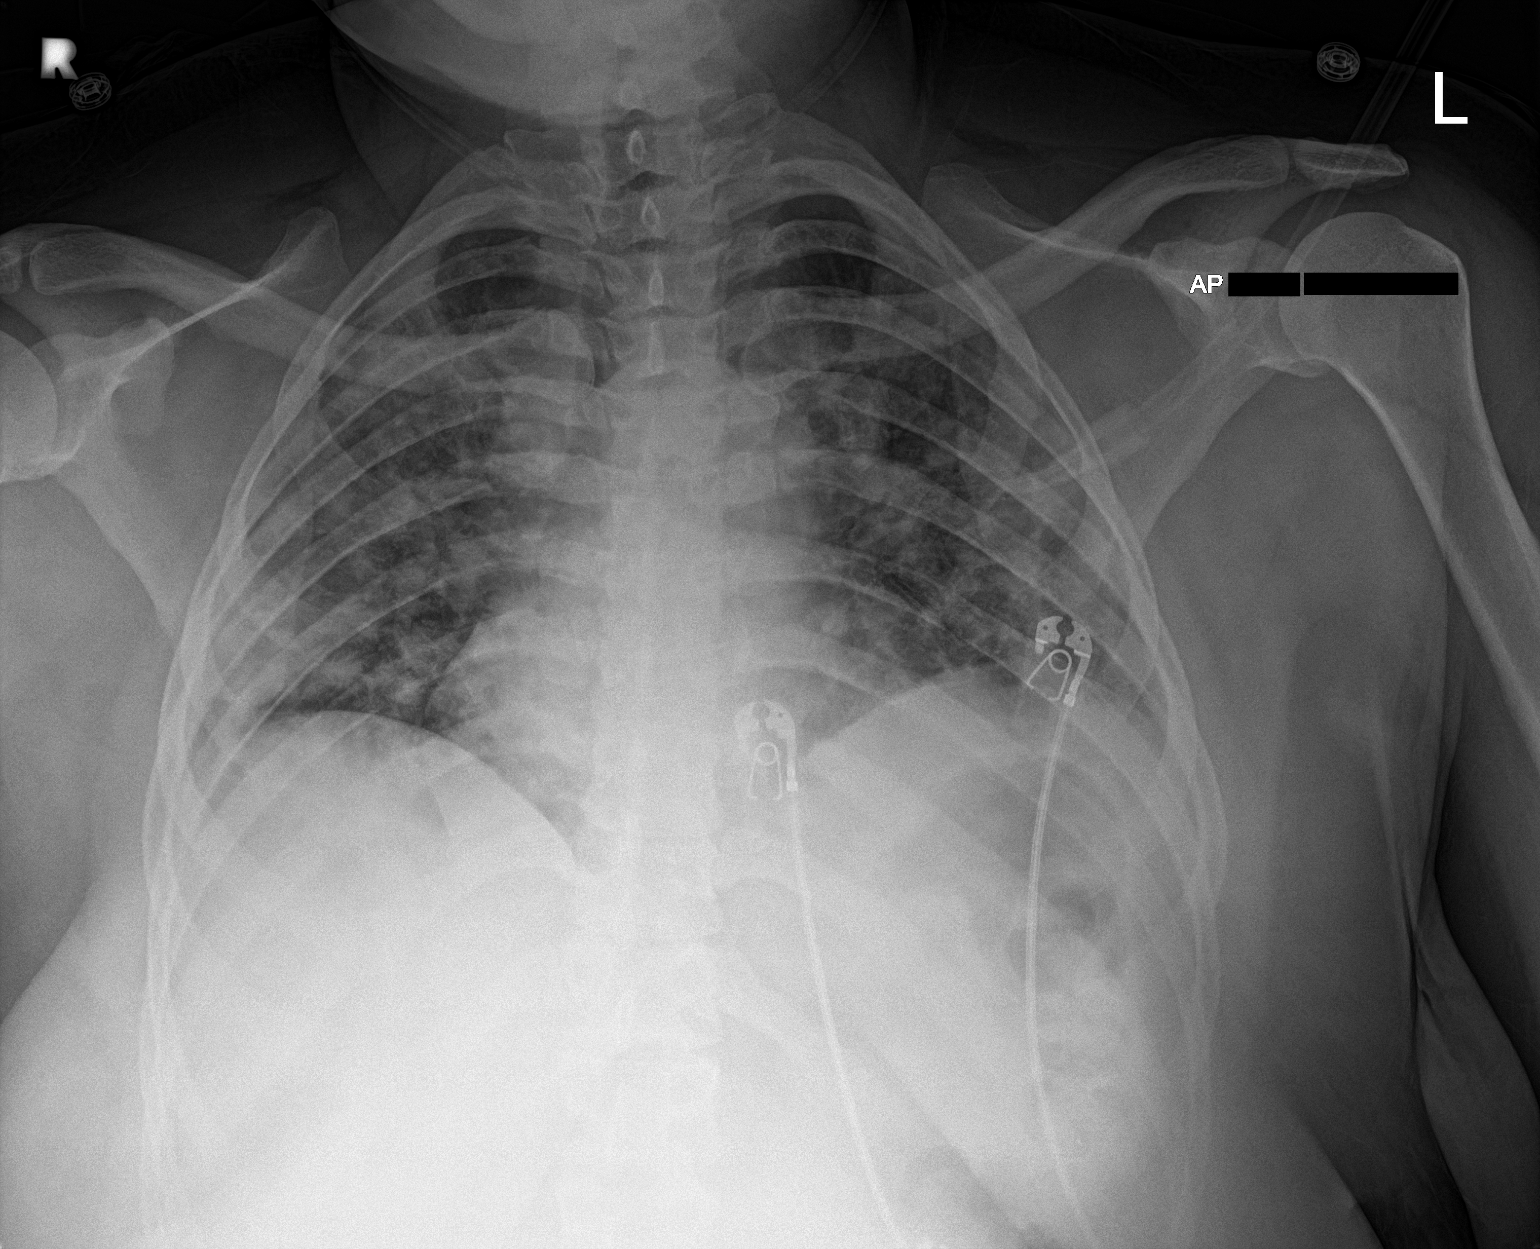

[1 of 1 positions shown; findings below may reference images not displayed]

FINDINGS: Trachea is midline. Heart size stable. Lungs are low in volume with
diffuse bilateral airspace opacification, similar to yesterday's
exam. No definite pleural fluid.
IMPRESSION: [facility] pneumonia, stable from 11/06/2019.

## 2022-06-08 ENCOUNTER — Encounter (HOSPITAL_COMMUNITY): Payer: Self-pay | Admitting: Emergency Medicine

## 2022-06-08 ENCOUNTER — Ambulatory Visit (HOSPITAL_COMMUNITY)
Admission: EM | Admit: 2022-06-08 | Discharge: 2022-06-08 | Disposition: A | Discharge status: Home / Self Care | Payer: Self-pay | Attending: Physician Assistant | Admitting: Physician Assistant

## 2022-06-08 DIAGNOSIS — G9331 Postviral fatigue syndrome: Secondary | ICD-10-CM | POA: Insufficient documentation

## 2022-06-08 DIAGNOSIS — R0602 Shortness of breath: Secondary | ICD-10-CM | POA: Insufficient documentation

## 2022-06-08 DIAGNOSIS — Z1152 Encounter for screening for COVID-19: Secondary | ICD-10-CM | POA: Insufficient documentation

## 2022-06-08 LAB — SARS CORONAVIRUS 2 (TAT 6-24 HRS): SARS Coronavirus 2: NEGATIVE

## 2022-06-08 MED ORDER — AMLODIPINE BESYLATE 5 MG PO TABS: 5.0000 mg | ORAL_TABLET | Freq: Every day | ORAL | 2 refills | Status: DC

## 2022-06-08 MED ORDER — ALBUTEROL SULFATE HFA 108 (90 BASE) MCG/ACT IN AERS: 1.0000 | INHALATION_SPRAY | Freq: Four times a day (QID) | RESPIRATORY_TRACT | 0 refills | Status: DC | PRN

## 2022-06-08 NOTE — ED Provider Notes (Signed)
[facility]    CSN: [id number] Arrival date & time: 06/08/22  1006      History   Chief Complaint Chief Complaint  Patient presents with   Fatigue   Shortness of Breath    HPI Melanie Hunt is a 36 y.o. female.   36 year old female presents with shortness of breath and fatigue.  Patient indicates that she had Hiram in 2021 in which she was hospitalized for a week with COVID-pneumonia.  She indicates that she recovered well.  Patient indicates for the past 4 days she has been having increased fatigue, with shortness of breath which is mainly exertional, she has to sit down in order to recover.  Patient indicates that she is not having any wheezing, and has been having minimal congestion and cough without production.  She indicates she is not having any fever, chills, body aches or pain.  She denies any nausea vomiting.  She does indicate that she has been around family member who is also been sick and having similar symptoms.  She is concerned that she may have COVID because she had fatigue and shortness of breath with her last episode of COVID.  Patient has been advised that she did have a low vitamin D level in the past and along with a low hemoglobin in the past.  She has been advised if her symptoms do not improve over the next 10 days and the COVID test is negative then she should be seen by her PCP to evaluate the possible causes of her fatigue and shortness of breath. Patient also indicates that she is taking amlodipine 10 mg for blood pressure control however this dose gave her headaches so she has reduced it to 5 mg a day.  She relates she has not taken her blood pressure medicine today, and she desires to have 5 mg tablets.  She does have a PCP however has not been to the office for an evaluation in many months.  Patient denies any nausea or vomiting.   Shortness of Breath   Past Medical History:  Diagnosis Date   Acute hypoxemic respiratory failure due to COVID-19  ([place]) 11/04/2019   HTN (hypertension)    Idiopathic angioedema     Patient Active Problem List   Diagnosis Date Noted   IUD (intrauterine device) in place 08/20/2020   Obesity (BMI 35.0-39.9 without comorbidity) 11/09/2019   Essential hypertension 06/09/2018   Metrorrhagia 12/06/2012    Past Surgical History:  Procedure Laterality Date   UTERINE ABLATION      OB History   No obstetric history on file.      Home Medications    Prior to Admission medications   Medication Sig Start Date End Date Taking? Authorizing Provider  albuterol (VENTOLIN HFA) 108 (90 Base) MCG/ACT inhaler Inhale 1-2 puffs into the lungs every 6 (six) hours as needed for wheezing or shortness of breath. 06/08/22  Yes Nyoka Lint, PA-C  amLODipine (NORVASC) 5 MG tablet Take 1 tablet (5 mg total) by mouth daily. 06/08/22  Yes Nyoka Lint, PA-C  acetaminophen (TYLENOL) 500 MG tablet Take 1,000 mg by mouth every 6 (six) hours as needed for mild pain.    [provider]  famotidine (PEPCID) 20 MG tablet Take 1 tablet (20 mg total) by mouth daily. 08/20/20   Elsie Stain, MD  fluticasone (FLONASE) 50 MCG/ACT nasal spray Place 2 sprays into both nostrils daily. 08/20/20   Elsie Stain, MD  meloxicam (MOBIC) 15 MG tablet Take  1 tablet (15 mg total) by mouth daily. 02/28/21   Cuthriell, Delorise Royals, PA-C  methocarbamol (ROBAXIN) 500 MG tablet Take 1 tablet (500 mg total) by mouth 4 (four) times daily. 02/28/21   Cuthriell, Delorise Royals, PA-C    Family History Family History  Problem Relation Age of Onset   Hypertension Mother    Hypertension Maternal Uncle    Hypertension Maternal Grandmother    Esophageal cancer Neg Hx    Colon cancer Neg Hx    Rectal cancer Neg Hx    Stomach cancer Neg Hx     Social History Social History   Tobacco Use   Smoking status: Never   Smokeless tobacco: Never  Vaping Use   Vaping Use: Never used  Substance Use Topics   Alcohol use: Yes    Comment: 3 to 4  times per week.   Drug use: Never     Allergies   Patient has no known allergies.   Review of Systems Review of Systems  Constitutional:  Positive for fatigue.  Respiratory:  Positive for shortness of breath.      Physical Exam Triage Vital Signs ED Triage Vitals  Enc Vitals Group     BP 06/08/22 1040 (!) 162/118     Pulse Rate 06/08/22 1040 88     Resp 06/08/22 1040 18     Temp 06/08/22 1040 98.3 F (36.8 C)     Temp Source 06/08/22 1040 Oral     SpO2 06/08/22 1040 97 %     Weight --      Height --      Head Circumference --      Peak Flow --      Pain Score 06/08/22 1037 0     Pain Loc --      Pain Edu? --      Excl. in GC? --    No data found.  Updated Vital Signs BP (!) 162/118 (BP Location: Right Arm) Comment: hasnt had HTN meds today  Pulse 88   Temp 98.3 F (36.8 C) (Oral)   Resp 18   SpO2 97%   Visual Acuity Right Eye Distance:   Left Eye Distance:   Bilateral Distance:    Right Eye Near:   Left Eye Near:    Bilateral Near:     Physical Exam Constitutional:      Appearance: She is well-developed.  HENT:     Right Ear: Tympanic membrane and ear canal normal.     Left Ear: Tympanic membrane and ear canal normal.     Mouth/Throat:     Mouth: Mucous membranes are moist.     Pharynx: Oropharynx is clear.  Cardiovascular:     Rate and Rhythm: Normal rate and regular rhythm.     Heart sounds: Normal heart sounds.  Pulmonary:     Effort: Pulmonary effort is normal.     Breath sounds: Normal breath sounds and air entry. No wheezing, rhonchi or rales.  Lymphadenopathy:     Cervical: No cervical adenopathy.  Neurological:     Mental Status: She is alert.      UC Treatments / Results  Labs (all labs ordered are listed, but only abnormal results are displayed) Labs Reviewed  SARS CORONAVIRUS 2 (TAT 6-24 HRS)    EKG   Radiology No results found.  Procedures Procedures (including critical care time)  Medications Ordered in  UC Medications - No data to display  Initial Impression / Assessment and Plan /  UC Course  I have reviewed the triage vital signs and the nursing notes.  Pertinent labs & imaging results that were available during my care of the patient were reviewed by me and considered in my medical decision making (see chart for details).       Plan: 1.  COVID test is pending. 2.  Advised to use the albuterol inhaler when needed for wheezing and shortness of breath. 3.  Amlodipine 5 mg daily has been sent to the pharmacy to help control blood pressure. 4.  Advised to follow-up with PCP if symptoms fail to improve in the next week to 10 days, return to urgent care as needed. Final Clinical Impressions(s) / UC Diagnoses   Final diagnoses:  Postviral fatigue syndrome  Shortness of breath  Encounter for screening for COVID-19     Discharge Instructions      COVID test will be completed in 24 hours or less, if you do not get a call from this office it indicates the test is negative, you can go on MyChart to be the results when it post in 24 hours or less. Advised to continue the amlodipine 5 mg daily to help control the blood pressure elevations. Advised to use the albuterol inhaler as needed for wheezing and shortness of breath. Advised to follow-up with PCP or return to urgent care if symptoms fail to improve.    ED Prescriptions     Medication Sig Dispense Auth. Provider   amLODipine (NORVASC) 5 MG tablet Take 1 tablet (5 mg total) by mouth daily. 30 tablet Ellsworth Lennox, PA-C   albuterol (VENTOLIN HFA) 108 (90 Base) MCG/ACT inhaler Inhale 1-2 puffs into the lungs every 6 (six) hours as needed for wheezing or shortness of breath. 8 g Ellsworth Lennox, PA-C      PDMP not reviewed this encounter.   Ellsworth Lennox, PA-C 06/08/22 1123

## 2022-06-08 NOTE — ED Triage Notes (Signed)
For a couple days had fatigue and SOB with exertion. Requesting Covid test.

## 2022-06-08 NOTE — Discharge Instructions (Addendum)
COVID test will be completed in 24 hours or less, if you do not get a call from this office it indicates the test is negative, you can go on MyChart to be the results when it post in 24 hours or less. Advised to continue the amlodipine 5 mg daily to help control the blood pressure elevations. Advised to use the albuterol inhaler as needed for wheezing and shortness of breath. Advised to follow-up with PCP or return to urgent care if symptoms fail to improve.

## 2023-01-10 ENCOUNTER — Encounter (HOSPITAL_COMMUNITY): Payer: Self-pay

## 2023-01-10 ENCOUNTER — Ambulatory Visit (HOSPITAL_COMMUNITY)
Admission: EM | Admit: 2023-01-10 | Discharge: 2023-01-10 | Disposition: A | Discharge status: Home / Self Care | Payer: Self-pay | Attending: Emergency Medicine | Admitting: Emergency Medicine

## 2023-01-10 DIAGNOSIS — M79674 Pain in right toe(s): Secondary | ICD-10-CM

## 2023-01-10 DIAGNOSIS — I10 Essential (primary) hypertension: Secondary | ICD-10-CM

## 2023-01-10 MED ORDER — AMLODIPINE BESYLATE 10 MG PO TABS: 10.0000 mg | ORAL_TABLET | Freq: Every day | ORAL | 1 refills | Status: DC

## 2023-01-10 MED ORDER — LISINOPRIL 20 MG PO TABS: 20.0000 mg | ORAL_TABLET | Freq: Every day | ORAL | 1 refills | Status: DC

## 2023-01-10 MED ORDER — MUPIROCIN 2 % EX OINT: 1.0000 | TOPICAL_OINTMENT | Freq: Two times a day (BID) | CUTANEOUS | 0 refills | Status: DC

## 2023-01-10 NOTE — ED Triage Notes (Signed)
Pt states that her right big toe hurts and swollen. She had an ingrown toenail she tried to remove and it may be infected. X2 weeks. She wears steel toe boots that's irritates  the toe.

## 2023-01-10 NOTE — Discharge Instructions (Addendum)
I am changing your blood pressure medication. Please take the lisinopril 20 mg every single day to control pressure. Please call your primary care provider for follow up regarding your elevated BP and medication changes. If at any point you develop severe headache, vision changes, chest pain or tightness, shortness of breath, weakness, please go directly to the emergency department.  For the toe, apply the ointment twice daily.  I advise against using any peroxide in the area as this can interfere with wound healing.  You can wrap the toe to keep it cushioned.  I recommend wearing softer shoes if possible. Please call the foot specialist for follow-up if needed.

## 2023-01-10 NOTE — ED Provider Notes (Signed)
[facility]    CSN: [id number] Arrival date & time: 01/10/23  1103      History   Chief Complaint Chief Complaint  Patient presents with   Toe Injury    HPI Melanie Hunt is a 37 y.o. female.  Here with toe pain Right great toe swelling, pain 7/10. She has concern for infection. Reports she had an ingrown nail she tried to remove 2 weeks ago Wears steel toed boots that aggravate symptoms Has been applying peroxide daily  Hx HTN. Has not taken her medication in several months. Was prescribed amlodipine 5 mg daily. When increased to 10 mg she had headaches.  Today denies headache, dizziness, vision changes, chest pain, shortness of breath, abdominal pain, weakness  Past Medical History:  Diagnosis Date   Acute hypoxemic respiratory failure due to COVID-19 11/04/2019   HTN (hypertension)    Idiopathic angioedema     Patient Active Problem List   Diagnosis Date Noted   IUD (intrauterine device) in place 08/20/2020   Obesity (BMI 35.0-39.9 without comorbidity) 11/09/2019   Essential hypertension 06/09/2018   Metrorrhagia 12/06/2012    Past Surgical History:  Procedure Laterality Date   UTERINE ABLATION      OB History   No obstetric history on file.      Home Medications    Prior to Admission medications   Medication Sig Start Date End Date Taking? Authorizing Provider  lisinopril (ZESTRIL) 20 MG tablet Take 1 tablet (20 mg total) by mouth daily. 01/10/23  Yes Tifani Dack, Lurena Joiner, PA-C  mupirocin ointment (BACTROBAN) 2 % Apply 1 Application topically 2 (two) times daily. 01/10/23  Yes Saad Buhl, Lurena Joiner, PA-C    Family History Family History  Problem Relation Age of Onset   Hypertension Mother    Hypertension Maternal Uncle    Hypertension Maternal Grandmother    Esophageal cancer Neg Hx    Colon cancer Neg Hx    Rectal cancer Neg Hx    Stomach cancer Neg Hx     Social History Social History   Tobacco Use   Smoking status: Never   Smokeless  tobacco: Never  Vaping Use   Vaping Use: Never used  Substance Use Topics   Alcohol use: Yes    Comment: 3 to 4 times per week.   Drug use: Never     Allergies   Patient has no known allergies.   Review of Systems Review of Systems As per HPI  Physical Exam Triage Vital Signs ED Triage Vitals  Enc Vitals Group     BP 01/10/23 1148 (!) 213/141     Pulse Rate 01/10/23 1148 84     Resp 01/10/23 1148 20     Temp 01/10/23 1148 98.1 F (36.7 C)     Temp Source 01/10/23 1148 Oral     SpO2 01/10/23 1148 93 %     Weight 01/10/23 1146 260 lb (117.9 kg)     Height --      Head Circumference --      Peak Flow --      Pain Score 01/10/23 1145 7     Pain Loc --      Pain Edu? --      Excl. in GC? --    No data found.  Updated Vital Signs BP (!) 213/141 (BP Location: Right Arm)   Pulse 84   Temp 98.1 F (36.7 C) (Oral)   Resp 20   Wt 260 lb (117.9 kg)   SpO2 93%  BMI 40.72 kg/m    Physical Exam Vitals and nursing note reviewed.  Constitutional:      General: She is not in acute distress. HENT:     Head: Normocephalic and atraumatic.     Right Ear: Tympanic membrane and ear canal normal.     Left Ear: Tympanic membrane and ear canal normal.     Nose: Nose normal.     Mouth/Throat:     Mouth: Mucous membranes are moist.     Pharynx: Oropharynx is clear.  Eyes:     Extraocular Movements: Extraocular movements intact.     Conjunctiva/sclera: Conjunctivae normal.     Pupils: Pupils are equal, round, and reactive to light.  Cardiovascular:     Rate and Rhythm: Normal rate and regular rhythm.     Heart sounds: Normal heart sounds.  Pulmonary:     Effort: Pulmonary effort is normal. No respiratory distress.     Breath sounds: Normal breath sounds.  Abdominal:     Palpations: Abdomen is soft.     Tenderness: There is no abdominal tenderness.  Musculoskeletal:        General: Normal range of motion.     Cervical back: Normal range of motion.  Feet:      Comments: Small cut at medial aspect of right great toenail. Some dried blood but no drainage or swelling. Tender to touch. Cap refill < 2 seconds. No paronychia or fluctuance surrounding.  Neurological:     General: No focal deficit present.     Mental Status: She is alert and oriented to person, place, and time.     Cranial Nerves: Cranial nerves 2-12 are intact. No cranial nerve deficit.     Sensory: Sensation is intact. No sensory deficit.     Motor: Motor function is intact. No weakness.     Coordination: Coordination is intact. Coordination normal.     Gait: Gait normal.     Comments: Strength 5/5 throughout.  Sensation and pulses intact     UC Treatments / Results  Labs (all labs ordered are listed, but only abnormal results are displayed) Labs Reviewed - No data to display  EKG   Radiology No results found.  Procedures Procedures (including critical care time)  Medications Ordered in UC Medications - No data to display  Initial Impression / Assessment and Plan / UC Course  I have reviewed the triage vital signs and the nursing notes.  Pertinent labs & imaging results that were available during my care of the patient were reviewed by me and considered in my medical decision making (see chart for details).  HTN Elevated BP today. No red flags - neurologically intact. Will change to lisinopril 20 mg daily; advised to take every single day to control pressure. Strict ED precautions. Recommend follow with primary as well  Toe pain Mupirocin ointment BID. Advised against peroxide use and recommend to not manipulate the nail any further. Can use ibu or tylenol for pain, soft shoes, bandage for protection. Follow with podiatry if needed.  Final Clinical Impressions(s) / UC Diagnoses   Final diagnoses:  Primary hypertension  Toe pain, right     Discharge Instructions      I am changing your blood pressure medication. Please take the lisinopril 20 mg every single day  to control pressure. Please call your primary care provider for follow up regarding your elevated BP and medication changes. If at any point you develop severe headache, vision changes, chest pain or tightness, shortness  of breath, weakness, please go directly to the emergency department.  For the toe, apply the ointment twice daily.  I advise against using any peroxide in the area as this can interfere with wound healing.  You can wrap the toe to keep it cushioned.  I recommend wearing softer shoes if possible. Please call the foot specialist for follow-up if needed.     ED Prescriptions     Medication Sig Dispense Auth. Provider   amLODipine (NORVASC) 10 MG tablet  (Status: Discontinued) Take 1 tablet (10 mg total) by mouth daily. 30 tablet Kehlani Vancamp, PA-C   lisinopril (ZESTRIL) 20 MG tablet Take 1 tablet (20 mg total) by mouth daily. 30 tablet Ranson Belluomini, PA-C   mupirocin ointment (BACTROBAN) 2 % Apply 1 Application topically 2 (two) times daily. 15 g Tymon Nemetz, Lurena Joiner, PA-C      PDMP not reviewed this encounter.   Kathrine Haddock 01/10/23 3086

## 2023-02-03 ENCOUNTER — Ambulatory Visit: Payer: Self-pay | Admitting: Podiatry

## 2023-02-04 ENCOUNTER — Other Ambulatory Visit: Payer: Self-pay | Admitting: Critical Care Medicine

## 2023-02-04 NOTE — Telephone Encounter (Signed)
Medication Refill - Medication: lisinopril (ZESTRIL) 20 MG tablet   Has the patient contacted their pharmacy? No.  Preferred Pharmacy (with phone number or street name):  [id number] [place], [place] - [address] Phone: [phone]  Fax: [phone]     Has the patient been seen for an appointment in the last year OR does the patient have an upcoming appointment? Yes.    Agent: Please be advised that RX refills may take up to 3 business days. We ask that you follow-up with your pharmacy.  Patient was prescribe this medication at the ED but has an appt with Georgian Co as there there no appt with Dr Delford Field until Aug. Patient has enough medication to take her to Monday 02/09/23. Please advise

## 2023-02-05 MED ORDER — LISINOPRIL 20 MG PO TABS: 20.0000 mg | ORAL_TABLET | Freq: Every day | ORAL | 1 refills | Status: DC

## 2023-02-05 NOTE — Telephone Encounter (Signed)
Requested medication (s) are due for refill today: no  Requested medication (s) are on the active medication list: yes    Last refill: 01/10/23  #30  1 refill  Future visit scheduled yes 02/19/23  Notes to clinic:  Historical provider, labs due,please review. Pt has appt 02/19/23  Requested Prescriptions  Pending Prescriptions Disp Refills   lisinopril (ZESTRIL) 20 MG tablet 30 tablet 1    Sig: Take 1 tablet (20 mg total) by mouth daily.     Cardiovascular:  ACE Inhibitors Failed - 02/04/2023  1:25 PM      Failed - Cr in normal range and within 180 days    Creatinine, Ser  Date Value Ref Range Status  02/01/2020 0.84 0.57 - 1.00 mg/dL Final         Failed - K in normal range and within 180 days    Potassium  Date Value Ref Range Status  02/01/2020 4.5 3.5 - 5.2 mmol/L Final         Failed - Last BP in normal range    BP Readings from Last 1 Encounters:  01/10/23 (!) 213/141         Failed - Valid encounter within last 6 months    Recent Outpatient Visits           2 years ago Essential hypertension   [place] [facility] Storm Frisk, MD   2 years ago Essential hypertension   [place] [facility] [place], [place], [place]   2 years ago Essential hypertension   [place] [facility] [place], Cornelius Moras, RPH-CPP   3 years ago Essential hypertension   [place] [facility] [place], [place], [place]   3 years ago Acute hypoxemic respiratory failure due to COVID-19 [facility])   [facility] Storm Frisk, MD       Future Appointments             In 2 weeks Sharon Seller [facility]            Passed - Patient is not pregnant

## 2023-02-19 ENCOUNTER — Encounter: Payer: Self-pay | Admitting: Physician Assistant

## 2023-02-19 ENCOUNTER — Other Ambulatory Visit: Payer: Self-pay

## 2023-02-19 ENCOUNTER — Ambulatory Visit: Payer: Self-pay | Attending: Physician Assistant | Admitting: Physician Assistant

## 2023-02-19 VITALS — BP 174/120 | HR 76 | Wt 260.4 lb

## 2023-02-19 DIAGNOSIS — Z09 Encounter for follow-up examination after completed treatment for conditions other than malignant neoplasm: Secondary | ICD-10-CM

## 2023-02-19 DIAGNOSIS — J9801 Acute bronchospasm: Secondary | ICD-10-CM

## 2023-02-19 DIAGNOSIS — I10 Essential (primary) hypertension: Secondary | ICD-10-CM

## 2023-02-19 MED ORDER — HYDROCHLOROTHIAZIDE 25 MG PO TABS
25.0000 mg | ORAL_TABLET | Freq: Every day | ORAL | 3 refills | Status: DC
  Filled 2023-02-19: qty 90, 90d supply, fill #0
  Filled 2023-06-10: qty 90, 90d supply, fill #1

## 2023-02-19 MED ORDER — LISINOPRIL 20 MG PO TABS
20.0000 mg | ORAL_TABLET | Freq: Every day | ORAL | 1 refills | Status: DC
  Filled 2023-02-19: qty 90, 90d supply, fill #0
  Filled 2023-06-10: qty 90, 90d supply, fill #1

## 2023-02-19 MED ORDER — ALBUTEROL SULFATE HFA 108 (90 BASE) MCG/ACT IN AERS
2.0000 | INHALATION_SPRAY | Freq: Four times a day (QID) | RESPIRATORY_TRACT | 2 refills | Status: DC | PRN
  Filled 2023-02-19: qty 6.7, 25d supply, fill #0

## 2023-02-19 NOTE — Patient Instructions (Signed)
Check blood pressure daily and record.  Bring these numbers to your next visit

## 2023-02-19 NOTE — Progress Notes (Signed)
Patient ID: Melanie Hunt, female   DOB: 1986-04-09, 37 y.o.   MRN: [id number]   Melanie Hunt, is a 37 y.o. female  WUJ:[id number]  [id number]  DOB - Feb 11, 1986  Chief Complaint  Patient presents with   ingrown toe nail    Hypertension   Medication Refill       Subjective:   Melanie Hunt is a 37 y.o. female here today for a follow up visit after being seen in the ED for uncontrolled htn 01/10/2023 and ingrown nail.  She could not afford to go to podiatry and has been using mupiricin with some improvement.  She previously took amlodipine and it caused some foot swelling.  She was prescribed lisinopril but did not take it today.  Some HA but not new.  No CP/dizziness  From ED note:  HTN Elevated BP today. No red flags - neurologically intact. Will change to lisinopril 20 mg daily; advised to take every single day to control pressure. Strict ED precautions. Recommend follow with primary as well   Toe pain Mupirocin ointment BID. Advised against peroxide use and recommend to not manipulate the nail any further. Can use ibu or tylenol for pain, soft shoes, bandage for protection. Follow with podiatry if needed. No problems updated.  ALLERGIES: No Known Allergies  PAST MEDICAL HISTORY: Past Medical History:  Diagnosis Date   Acute hypoxemic respiratory failure due to COVID-19 [facility]) 11/04/2019   HTN (hypertension)    Idiopathic angioedema     MEDICATIONS AT HOME: Prior to Admission medications   Medication Sig Start Date End Date Taking? Authorizing Provider  albuterol (VENTOLIN HFA) 108 (90 Base) MCG/ACT inhaler Inhale 2 puffs into the lungs every 6 (six) hours as needed for wheezing or shortness of breath. 02/19/23  Yes Georgian Co M, PA-C  hydrochlorothiazide (HYDRODIURIL) 25 MG tablet Take 1 tablet (25 mg total) by mouth daily. 02/19/23  Yes Georgian Co M, PA-C  mupirocin ointment (BACTROBAN) 2 % Apply 1 Application topically 2 (two) times daily. 01/10/23  Yes Rising,  Lurena Joiner, PA-C  lisinopril (ZESTRIL) 20 MG tablet Take 1 tablet (20 mg total) by mouth daily. 02/19/23   Sion Thane, Marzella Schlein, PA-C    ROS: Neg HEENT Neg resp Neg cardiac Neg GI Neg GU Neg MS Neg psych Neg neuro  Objective:   Vitals:   02/19/23 1356 02/19/23 1408  BP: (!) 182/136 (!) 174/120  Pulse: 76   SpO2: 94%   Weight: 260 lb 6.4 oz (118.1 kg)    Exam General appearance : Awake, alert, not in any distress. Speech Clear. Not toxic looking HEENT: Atraumatic and Normocephalic Neck: Supple, no JVD. No cervical lymphadenopathy.  Chest: Good air entry bilaterally, CTAB.  No rales/rhonchi/wheezing CVS: S1 S2 regular, no murmurs.  Extremities: B/L Lower Ext shows no edema, both legs are warm to touch Neurology: Awake alert, and oriented X 3, CN II-XII intact, Non focal Skin: No Rash R great toe with resolving infection but there is a granuloma forming.    Data Review No results found for: "HGBA1C"  Assessment & Plan   1. Essential hypertension Declines clonidine in office - Comprehensive metabolic panel - hydrochlorothiazide (HYDRODIURIL) 25 MG tablet; Take 1 tablet (25 mg total) by mouth daily.  Dispense: 90 tablet; Refill: 3 - lisinopril (ZESTRIL) 20 MG tablet; Take 1 tablet (20 mg total) by mouth daily.  Dispense: 90 tablet; Refill: 1 She will borrow her mom's BP cuff and check BP daily  2. Hospital discharge follow-up  3. Bronchospasm -  albuterol (VENTOLIN HFA) 108 (90 Base) MCG/ACT inhaler; Inhale 2 puffs into the lungs every 6 (six) hours as needed for wheezing or shortness of breath.  Dispense: 6.7 g; Refill: 2  4. Ingrown nail-salt water soaks, continue mupiricin.  UC/podiatry if removal needed/does not continue to improve   Return in about 5 weeks (around 03/26/2023) for 5 weeks with Franky Macho for BP and 3 months with Dr Delford Field.  The patient was given clear instructions to go to ER or return to medical center if symptoms don't improve, worsen or new problems develop.  The patient verbalized understanding. The patient was told to call to get lab results if they haven't heard anything in the next week.      Georgian Co, PA-C [facility] [place], [place] [phone]   02/20/2023, 5:29 PM

## 2023-02-20 ENCOUNTER — Other Ambulatory Visit: Payer: Self-pay

## 2023-02-20 LAB — COMPREHENSIVE METABOLIC PANEL
ALT: 22 IU/L (ref 0–32)
AST: 16 IU/L (ref 0–40)
Albumin/Globulin Ratio: 1.9 (ref 1.2–2.2)
Albumin: 4.5 g/dL (ref 3.9–4.9)
Alkaline Phosphatase: 118 IU/L (ref 44–121)
BUN/Creatinine Ratio: 13 (ref 9–23)
BUN: 11 mg/dL (ref 6–20)
Bilirubin Total: 0.3 mg/dL (ref 0.0–1.2)
CO2: 20 mmol/L (ref 20–29)
Calcium: 8.8 mg/dL (ref 8.7–10.2)
Chloride: 103 mmol/L (ref 96–106)
Creatinine, Ser: 0.84 mg/dL (ref 0.57–1.00)
Globulin, Total: 2.4 g/dL (ref 1.5–4.5)
Glucose: 117 mg/dL — ABNORMAL HIGH (ref 70–99)
Potassium: 4.1 mmol/L (ref 3.5–5.2)
Sodium: 138 mmol/L (ref 134–144)
Total Protein: 6.9 g/dL (ref 6.0–8.5)
eGFR: 92 mL/min/{1.73_m2} (ref >59)

## 2023-02-24 ENCOUNTER — Telehealth: Payer: Self-pay

## 2023-02-24 NOTE — Progress Notes (Signed)
Call placed to patient unable to reach message left on VM.  To advise patient per PCP " Your blood sugar is a little elevated which can put you at risk for diabetes.  Avoid sugars and starches.  Kidney and liver function is normal. "

## 2023-02-24 NOTE — Telephone Encounter (Signed)
Pt given lab results per notes of Marylene Land, Georgia on 02/24/23. Pt verbalized understanding.   Your blood sugar is a little elevated which can put you at risk for diabetes.  Avoid sugars and starches.  Kidney and liver function is normal.  Thanks, Georgian Co, PA-C  Written by Anders Simmonds, PA-C on 02/20/2023  4:39 PM EDT

## 2023-03-31 ENCOUNTER — Ambulatory Visit: Payer: Self-pay | Admitting: Pharmacist

## 2023-04-14 ENCOUNTER — Ambulatory Visit: Payer: Self-pay | Admitting: Pharmacist

## 2023-05-03 ENCOUNTER — Ambulatory Visit (HOSPITAL_COMMUNITY)
Admission: EM | Admit: 2023-05-03 | Discharge: 2023-05-03 | Disposition: A | Discharge status: Home / Self Care | Payer: Self-pay | Attending: Internal Medicine | Admitting: Internal Medicine

## 2023-05-03 ENCOUNTER — Encounter (HOSPITAL_COMMUNITY): Payer: Self-pay

## 2023-05-03 DIAGNOSIS — R000 Tachycardia, unspecified: Secondary | ICD-10-CM | POA: Insufficient documentation

## 2023-05-03 DIAGNOSIS — J4521 Mild intermittent asthma with (acute) exacerbation: Secondary | ICD-10-CM | POA: Insufficient documentation

## 2023-05-03 DIAGNOSIS — R509 Fever, unspecified: Secondary | ICD-10-CM | POA: Insufficient documentation

## 2023-05-03 DIAGNOSIS — J9801 Acute bronchospasm: Secondary | ICD-10-CM

## 2023-05-03 DIAGNOSIS — U071 COVID-19: Secondary | ICD-10-CM | POA: Insufficient documentation

## 2023-05-03 DIAGNOSIS — J069 Acute upper respiratory infection, unspecified: Secondary | ICD-10-CM

## 2023-05-03 MED ORDER — METHYLPREDNISOLONE SODIUM SUCC 125 MG IJ SOLR
80.0000 mg | Freq: Once | INTRAMUSCULAR | Status: AC
  Administered 2023-05-03: 80 mg via INTRAMUSCULAR

## 2023-05-03 MED ORDER — IPRATROPIUM-ALBUTEROL 0.5-2.5 (3) MG/3ML IN SOLN
RESPIRATORY_TRACT | Status: AC
  Filled 2023-05-03: qty 3

## 2023-05-03 MED ORDER — IBUPROFEN 800 MG PO TABS
800.0000 mg | ORAL_TABLET | Freq: Once | ORAL | Status: AC
  Administered 2023-05-03: 800 mg via ORAL

## 2023-05-03 MED ORDER — IBUPROFEN 800 MG PO TABS
ORAL_TABLET | ORAL | Status: AC
  Filled 2023-05-03: qty 1

## 2023-05-03 MED ORDER — ACETAMINOPHEN 325 MG PO TABS
ORAL_TABLET | ORAL | Status: AC
  Filled 2023-05-03: qty 2

## 2023-05-03 MED ORDER — PROMETHAZINE-DM 6.25-15 MG/5ML PO SYRP: 5.0000 mL | ORAL_SOLUTION | Freq: Four times a day (QID) | ORAL | 0 refills | Status: DC | PRN

## 2023-05-03 MED ORDER — PREDNISONE 10 MG (21) PO TBPK: ORAL_TABLET | ORAL | 0 refills | Status: DC

## 2023-05-03 MED ORDER — ALBUTEROL SULFATE HFA 108 (90 BASE) MCG/ACT IN AERS: 2.0000 | INHALATION_SPRAY | Freq: Four times a day (QID) | RESPIRATORY_TRACT | 0 refills | Status: DC | PRN

## 2023-05-03 MED ORDER — ACETAMINOPHEN 325 MG PO TABS
650.0000 mg | ORAL_TABLET | Freq: Once | ORAL | Status: AC
  Administered 2023-05-03: 650 mg via ORAL

## 2023-05-03 MED ORDER — METHYLPREDNISOLONE SODIUM SUCC 125 MG IJ SOLR
INTRAMUSCULAR | Status: AC
  Filled 2023-05-03: qty 2

## 2023-05-03 MED ORDER — IPRATROPIUM-ALBUTEROL 0.5-2.5 (3) MG/3ML IN SOLN
3.0000 mL | Freq: Once | RESPIRATORY_TRACT | Status: AC
  Administered 2023-05-03: 3 mL via RESPIRATORY_TRACT

## 2023-05-03 NOTE — ED Provider Notes (Signed)
[facility]    CSN: [id number] Arrival date & time: 05/03/23  1602      History   Chief Complaint Chief Complaint  Patient presents with   Shortness of Breath   Headache   Generalized Body Aches   Fatigue    HPI Melanie Hunt is a 37 y.o. female.   Patient presents today with a 2-day history of URI symptoms.  Reports shortness of breath, cough, fatigue, body ache, fever, headache.  Denies any chest pain, nausea, vomiting, diarrhea.  Reports her mother was also sick but has not been diagnosed with anything specific.  She has had COVID several years ago and developed respiratory failure as result of this.  She has also been diagnosed with asthma since her hospitalization related to COVID and denies history of asthma when she was younger.  She has been using her albuterol inhaler more frequently with minimal improvement of symptoms.  Denies any recent antibiotics or steroids.  She has tried DayQuil and NyQuil without improvement.  She is confident she is not pregnant.  She has had COVID vaccines.    Past Medical History:  Diagnosis Date   Acute hypoxemic respiratory failure due to COVID-19 [facility]) 11/04/2019   HTN (hypertension)    Idiopathic angioedema     Patient Active Problem List   Diagnosis Date Noted   IUD (intrauterine device) in place 08/20/2020   Obesity (BMI 35.0-39.9 without comorbidity) 11/09/2019   Essential hypertension 06/09/2018   Metrorrhagia 12/06/2012    Past Surgical History:  Procedure Laterality Date   UTERINE ABLATION      OB History   No obstetric history on file.      Home Medications    Prior to Admission medications   Medication Sig Start Date End Date Taking? Authorizing Provider  predniSONE (STERAPRED UNI-PAK 21 TAB) 10 MG (21) TBPK tablet As directed 05/03/23  Yes ,  K, PA-C  promethazine-dextromethorphan (PROMETHAZINE-DM) 6.25-15 MG/5ML syrup Take 5 mLs by mouth 4 (four) times daily as needed for cough. 05/03/23  Yes  ,  K, PA-C  albuterol (VENTOLIN HFA) 108 (90 Base) MCG/ACT inhaler Inhale 2 puffs into the lungs every 6 (six) hours as needed for wheezing or shortness of breath. 05/03/23   , Noberto Retort, PA-C  hydrochlorothiazide (HYDRODIURIL) 25 MG tablet Take 1 tablet (25 mg total) by mouth daily. 02/19/23   Anders Simmonds, PA-C  lisinopril (ZESTRIL) 20 MG tablet Take 1 tablet (20 mg total) by mouth daily. 02/19/23   Anders Simmonds, PA-C  mupirocin ointment (BACTROBAN) 2 % Apply 1 Application topically 2 (two) times daily. 01/10/23   Rising, Lurena Joiner, PA-C    Family History Family History  Problem Relation Age of Onset   Hypertension Mother    Hypertension Maternal Uncle    Hypertension Maternal Grandmother    Esophageal cancer Neg Hx    Colon cancer Neg Hx    Rectal cancer Neg Hx    Stomach cancer Neg Hx     Social History Social History   Tobacco Use   Smoking status: Never   Smokeless tobacco: Never  Vaping Use   Vaping status: Never Used  Substance Use Topics   Alcohol use: Yes    Comment: 3 to 4 times per week.   Drug use: Never     Allergies   Patient has no known allergies.   Review of Systems Review of Systems  Constitutional:  Positive for activity change, fatigue and fever. Negative for appetite change.  HENT:  Positive for congestion and postnasal drip. Negative for sneezing and sore throat.   Respiratory:  Positive for cough and shortness of breath.   Cardiovascular:  Negative for chest pain.  Gastrointestinal:  Negative for abdominal pain, diarrhea, nausea and vomiting.  Musculoskeletal:  Positive for arthralgias and myalgias.  Neurological:  Positive for headaches. Negative for dizziness and light-headedness.     Physical Exam Triage Vital Signs ED Triage Vitals  Encounter Vitals Group     BP 05/03/23 1719 (!) 151/86     Systolic BP Percentile --      Diastolic BP Percentile --      Pulse Rate 05/03/23 1719 (!) 128     Resp 05/03/23 1719 18      Temp 05/03/23 1719 (!) 102.5 F (39.2 C)     Temp Source 05/03/23 1719 Oral     SpO2 05/03/23 1719 93 %     Weight --      Height --      Head Circumference --      Peak Flow --      Pain Score 05/03/23 1722 4     Pain Loc --      Pain Education --      Exclude from Growth Chart --    No data found.  Updated Vital Signs BP (!) 151/86 (BP Location: Right Arm)   Pulse (!) 122   Temp 99.6 F (37.6 C) (Oral)   Resp 18   SpO2 93%   Visual Acuity Right Eye Distance:   Left Eye Distance:   Bilateral Distance:    Right Eye Near:   Left Eye Near:    Bilateral Near:     Physical Exam Vitals reviewed.  Constitutional:      General: She is awake. She is not in acute distress.    Appearance: Normal appearance. She is well-developed. She is not ill-appearing.     Comments: Very pleasant female appears stated age in no acute distress sitting comfortably in exam room  HENT:     Head: Normocephalic and atraumatic.     Right Ear: Tympanic membrane, ear canal and external ear normal. Tympanic membrane is not erythematous or bulging.     Left Ear: Tympanic membrane, ear canal and external ear normal. Tympanic membrane is not erythematous or bulging.     Nose:     Right Sinus: No maxillary sinus tenderness or frontal sinus tenderness.     Left Sinus: No maxillary sinus tenderness or frontal sinus tenderness.     Mouth/Throat:     Pharynx: Uvula midline. No oropharyngeal exudate or posterior oropharyngeal erythema.  Cardiovascular:     Rate and Rhythm: Regular rhythm. Tachycardia present.     Heart sounds: Normal heart sounds, S1 normal and S2 normal. No murmur heard. Pulmonary:     Effort: Pulmonary effort is normal.     Breath sounds: Examination of the right-lower field reveals decreased breath sounds. Examination of the left-lower field reveals decreased breath sounds. Decreased breath sounds and wheezing present. No rhonchi or rales.     Comments: Widespread wheezing.  Decreased  aeration bilateral bases.  Reactive cough with deep breathing. Psychiatric:        Behavior: Behavior is cooperative.      UC Treatments / Results  Labs (all labs ordered are listed, but only abnormal results are displayed) Labs Reviewed  SARS CORONAVIRUS 2 (TAT 6-24 HRS)    EKG   Radiology No results found.  Procedures Procedures (  including critical care time)  Medications Ordered in UC Medications  acetaminophen (TYLENOL) tablet 650 mg (650 mg Oral Given 05/03/23 1727)  ipratropium-albuterol (DUONEB) 0.5-2.5 (3) MG/3ML nebulizer solution 3 mL (3 mLs Nebulization Given 05/03/23 1836)  methylPREDNISolone sodium succinate (SOLU-MEDROL) 125 mg/2 mL injection 80 mg (80 mg Intramuscular Given 05/03/23 1832)  ibuprofen (ADVIL) tablet 800 mg (800 mg Oral Given 05/03/23 1836)    Initial Impression / Assessment and Plan / UC Course  I have reviewed the triage vital signs and the nursing notes.  Pertinent labs & imaging results that were available during my care of the patient were reviewed by me and considered in my medical decision making (see chart for details).     Patient is tachycardic and febrile but this did improve somewhat with antipyretics in clinic.  She did have wheezing on exam that improved with DuoNeb and 80 mg of Solu-Medrol in clinic.  We discussed that she likely has a virus that is causing her symptoms.  I am concerned for COVID and so this testing is pending.  She is a candidate for antiviral therapy given her history of asthma and her last metabolic panel 02/19/2023 showed normal kidney function with creatinine of 0.84 with EGFR of 92 mL/min.  She does not need to stop any medication she should take Paxlovid based on the medications listed in chart today.  I am concerned that COVID has triggered her asthma and so a refill of albuterol was sent to pharmacy.  Will also start prednisone taper tomorrow (05/04/2023) since she was given Solu-Medrol in clinic today.  Discussed  that she is not to take NSAIDs with this medication.  Promethazine DM was sent in for cough.  We discussed that this can be sedating and she is not to drive or drink alcohol taking it.  Recommended she continue over-the-counter medications including Mucinex, Flonase, Tylenol.  Recommended nasal saline sinus rinses.  She is to monitor her oxygen saturation as well as her heart rate with a pulse oximeter.  We discussed that if her heart remains above at 100 consistently or if oxygen saturation drops below 90% she needs to go to the emergency room.  Discussed that if symptoms or not improving within a week she should return for reevaluation.  If she has any worsening symptoms she needs to be seen immediately.  Strict return precautions given.  Work excuse note provided.  Final Clinical Impressions(s) / UC Diagnoses   Final diagnoses:  Upper respiratory tract infection, unspecified type  Mild intermittent asthma with acute exacerbation  Tachycardia  Fever, unspecified     Discharge Instructions      I am concerned that you have COVID.  We will contact you if this is positive.  If you are interested in starting Paxlovid please let our nurse know when she calls you.  I have called in a steroid taper that you should take starting 05/04/2023.  Do not take NSAIDs with this medication including aspirin, ibuprofen/Advil, naproxen/Aleve.  Continue using her albuterol inhaler as needed.  I have sent in a refill.  Take Promethazine DM for cough.  This make you sleepy so do not drive or drink alcohol with taking it.  Make sure that you rest and drink plenty of fluid.  You can use over-the-counter medication including Mucinex, Flonase, Tylenol.  If your symptoms are improving or if anything worsens please return for reevaluation including worsening cough, shortness of breath, nausea/vomiting, weakness, chest pain.     ED Prescriptions  Medication Sig Dispense Auth. Provider   predniSONE (STERAPRED UNI-PAK  21 TAB) 10 MG (21) TBPK tablet As directed 21 tablet ,  K, PA-C   promethazine-dextromethorphan (PROMETHAZINE-DM) 6.25-15 MG/5ML syrup Take 5 mLs by mouth 4 (four) times daily as needed for cough. 118 mL ,  K, PA-C   albuterol (VENTOLIN HFA) 108 (90 Base) MCG/ACT inhaler Inhale 2 puffs into the lungs every 6 (six) hours as needed for wheezing or shortness of breath. 8 g , Noberto Retort, PA-C      PDMP not reviewed this encounter.   Jeani Hawking, PA-C 05/03/23 1910

## 2023-05-03 NOTE — Discharge Instructions (Addendum)
I am concerned that you have COVID.  We will contact you if this is positive.  If you are interested in starting Paxlovid please let our nurse know when she calls you.  I have called in a steroid taper that you should take starting 05/04/2023.  Do not take NSAIDs with this medication including aspirin, ibuprofen/Advil, naproxen/Aleve.  Continue using her albuterol inhaler as needed.  I have sent in a refill.  Take Promethazine DM for cough.  This make you sleepy so do not drive or drink alcohol with taking it.  Make sure that you rest and drink plenty of fluid.  You can use over-the-counter medication including Mucinex, Flonase, Tylenol.  If your symptoms are improving or if anything worsens please return for reevaluation including worsening cough, shortness of breath, nausea/vomiting, weakness, chest pain.

## 2023-05-03 NOTE — ED Triage Notes (Signed)
Patient c/o SOB, fatigue, generalized body aches, nasal congestion, headache since yesterday.  Patient states she has taken Advil cold and sinus and alka Seltzer cold and flu, aleve dual action and took all of these t 1100 today.

## 2023-05-07 ENCOUNTER — Ambulatory Visit (HOSPITAL_COMMUNITY)
Admission: EM | Admit: 2023-05-07 | Discharge: 2023-05-07 | Disposition: A | Discharge status: Home / Self Care | Payer: Self-pay | Attending: Emergency Medicine | Admitting: Emergency Medicine

## 2023-05-07 ENCOUNTER — Encounter (HOSPITAL_COMMUNITY): Payer: Self-pay | Admitting: *Deleted

## 2023-05-07 DIAGNOSIS — Z760 Encounter for issue of repeat prescription: Secondary | ICD-10-CM

## 2023-05-07 DIAGNOSIS — R052 Subacute cough: Secondary | ICD-10-CM

## 2023-05-07 MED ORDER — MUPIROCIN 2 % EX OINT: 1.0000 | TOPICAL_OINTMENT | Freq: Two times a day (BID) | CUTANEOUS | 0 refills | Status: DC

## 2023-05-07 NOTE — Discharge Instructions (Addendum)
We will extend patient's work note for 5 additional days  Patient becomes short of breath or increased weakness patient will need to follow-up in the emergency room Continuing using cough medicine as needed Use humidifier at nighttime drink plenty of fluids

## 2023-05-07 NOTE — ED Provider Notes (Signed)
[facility]    CSN: [id number] Arrival date & time: 05/07/23  0845      History   Chief Complaint Chief Complaint  Patient presents with   Letter for School/Work    HPI Melanie Hunt is a 37 y.o. female.   Patient presents today with a continued work note.  Patient was positive for COVID on 811 and feels that she has been unable to return back to work as a [employer].  Patient would like an extended amount of time out of work.  Patient is taking cough medicine and starting to feel better symptoms are starting to resolve.  Denies any chest pain or significant short of breath at this time. Patient is also asking for a refill on ointment for her toe    Past Medical History:  Diagnosis Date   Acute hypoxemic respiratory failure due to COVID-19 ([facility]) 11/04/2019   HTN (hypertension)    Idiopathic angioedema     Patient Active Problem List   Diagnosis Date Noted   IUD (intrauterine device) in place 08/20/2020   Obesity (BMI 35.0-39.9 without comorbidity) 11/09/2019   Essential hypertension 06/09/2018   Metrorrhagia 12/06/2012    Past Surgical History:  Procedure Laterality Date   UTERINE ABLATION      OB History   No obstetric history on file.      Home Medications    Prior to Admission medications   Medication Sig Start Date End Date Taking? Authorizing Provider  albuterol (VENTOLIN HFA) 108 (90 Base) MCG/ACT inhaler Inhale 2 puffs into the lungs every 6 (six) hours as needed for wheezing or shortness of breath. 05/03/23  Yes Raspet, Erin K, PA-C  hydrochlorothiazide (HYDRODIURIL) 25 MG tablet Take 1 tablet (25 mg total) by mouth daily. 02/19/23  Yes Georgian Co M, PA-C  lisinopril (ZESTRIL) 20 MG tablet Take 1 tablet (20 mg total) by mouth daily. 02/19/23  Yes McClung, Angela M, PA-C  predniSONE (STERAPRED UNI-PAK 21 TAB) 10 MG (21) TBPK tablet As directed 05/03/23  Yes Raspet, Erin K, PA-C  promethazine-dextromethorphan (PROMETHAZINE-DM) 6.25-15 MG/5ML  syrup Take 5 mLs by mouth 4 (four) times daily as needed for cough. 05/03/23  Yes Raspet, Denny Peon K, PA-C  mupirocin ointment (BACTROBAN) 2 % Apply 1 Application topically 2 (two) times daily. 05/07/23   Coralyn Mark, NP    Family History Family History  Problem Relation Age of Onset   Hypertension Mother    Hypertension Maternal Uncle    Hypertension Maternal Grandmother    Esophageal cancer Neg Hx    Colon cancer Neg Hx    Rectal cancer Neg Hx    Stomach cancer Neg Hx     Social History Social History   Tobacco Use   Smoking status: Never   Smokeless tobacco: Never  Vaping Use   Vaping status: Never Used  Substance Use Topics   Alcohol use: Yes    Comment: 3 to 4 times per week.   Drug use: Never     Allergies   Patient has no known allergies.   Review of Systems Review of Systems  Constitutional:  Positive for fatigue. Negative for chills and fever.  HENT: Negative.    Respiratory:  Positive for cough. Negative for shortness of breath and wheezing.   Cardiovascular: Negative.   Gastrointestinal: Negative.   Musculoskeletal:        Generalized body aches intermittently  Skin: Negative.   Neurological: Negative.      Physical Exam Triage Vital Signs  ED Triage Vitals [05/07/23 0911]  Encounter Vitals Group     BP (!) 151/98     Systolic BP Percentile      Diastolic BP Percentile      Pulse Rate 95     Resp 20     Temp 98.7 F (37.1 C)     Temp Source Oral     SpO2 96 %     Weight      Height      Head Circumference      Peak Flow      Pain Score 0     Pain Loc      Pain Education      Exclude from Growth Chart    No data found.  Updated Vital Signs BP (!) 151/98 (BP Location: Right Arm)   Pulse 95   Temp 98.7 F (37.1 C) (Oral)   Resp 20   SpO2 96%   Visual Acuity Right Eye Distance:   Left Eye Distance:   Bilateral Distance:    Right Eye Near:   Left Eye Near:    Bilateral Near:     Physical Exam Constitutional:       Appearance: Normal appearance.  HENT:     Right Ear: Tympanic membrane normal.     Left Ear: Tympanic membrane normal.     Nose: Congestion present.     Mouth/Throat:     Mouth: Mucous membranes are moist.  Eyes:     Pupils: Pupils are equal, round, and reactive to light.  Cardiovascular:     Rate and Rhythm: Normal rate.  Pulmonary:     Effort: Pulmonary effort is normal.     Breath sounds: No wheezing or rhonchi.  Abdominal:     General: Abdomen is flat.  Neurological:     General: No focal deficit present.     Mental Status: She is alert.      UC Treatments / Results  Labs (all labs ordered are listed, but only abnormal results are displayed) Labs Reviewed - No data to display  EKG   Radiology No results found.  Procedures Procedures (including critical care time)  Medications Ordered in UC Medications - No data to display  Initial Impression / Assessment and Plan / UC Course  I have reviewed the triage vital signs and the nursing notes.  Pertinent labs & imaging results that were available during my care of the patient were reviewed by me and considered in my medical decision making (see chart for details).     We will extend patient's work note for 5 additional days  Patient becomes short of breath or increased weakness patient will need to follow-up in the emergency room Continuing using cough medicine as needed Use humidifier at nighttime drink plenty of fluids  Final Clinical Impressions(s) / UC Diagnoses   Final diagnoses:  Subacute cough  Medication refill   Discharge Instructions   None    ED Prescriptions     Medication Sig Dispense Auth. Provider   mupirocin ointment (BACTROBAN) 2 % Apply 1 Application topically 2 (two) times daily. 15 g Coralyn Mark, NP      PDMP not reviewed this encounter.   Coralyn Mark, NP 05/07/23 1020

## 2023-05-07 NOTE — ED Triage Notes (Signed)
Pt states she tested positive for covid on 05/03/2023 and needs a note to remain out of work since she isnt feeling well. 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

## 2023-05-26 ENCOUNTER — Ambulatory Visit: Payer: Self-pay | Admitting: Critical Care Medicine

## 2023-05-29 ENCOUNTER — Encounter: Payer: Self-pay | Admitting: Pharmacist

## 2023-06-04 ENCOUNTER — Ambulatory Visit: Payer: Self-pay | Admitting: Critical Care Medicine

## 2023-06-04 NOTE — Progress Notes (Deleted)
Established Patient Office Visit  Subjective   Patient ID: Melanie Hunt, female    DOB: [birth year]  Age: 37 y.o. MRN: [id number]  No chief complaint on file.   37 y.o.F not seen since 2021 by Delford Field 01/2023 Mcclung: Melanie Hunt is a 37 y.o. female here today for a follow up visit after being seen in the ED for uncontrolled htn 01/10/2023 and ingrown nail.  She could not afford to go to podiatry and has been using mupiricin with some improvement.  She previously took amlodipine and it caused some foot swelling.  She was prescribed lisinopril but did not take it today.  Some HA but not new.  No CP/dizziness   From ED note:   HTN Elevated BP today. No red flags - neurologically intact. Will change to lisinopril 20 mg daily; advised to take every single day to control pressure. Strict ED precautions. Recommend follow with primary as well   Toe pain Mupirocin ointment BID. Advised against peroxide use and recommend to not manipulate the nail any further. Can use ibu or tylenol for pain, soft shoes, bandage for protection. Follow with podiatry if needed. No problems updated.   hydrochlorothiazide (HYDRODIURIL) 25 MG tablet; Take 1 tablet (25 mg total) by mouth daily.  Dispense: 90 tablet; Refill: 3 - lisinopril (ZESTRIL) 20 MG tablet; Take 1 tablet (20 mg total) by      {History (Optional):23778}  ROS    Objective:     There were no vitals taken for this visit. {Vitals History (Optional):23777}  Physical Exam   No results found for any visits on 06/04/23.  {Labs (Optional):23779}  The ASCVD Risk score (Arnett DK, et al., 2019) failed to calculate for the following reasons:   The 2019 ASCVD risk score is only valid for ages 31 to 19    Assessment & Plan:   Problem List Items Addressed This Visit   None   No follow-ups on file.    [name], MD

## 2023-06-10 ENCOUNTER — Other Ambulatory Visit: Payer: Self-pay

## 2023-07-08 ENCOUNTER — Other Ambulatory Visit: Payer: Self-pay

## 2023-07-08 ENCOUNTER — Emergency Department (HOSPITAL_COMMUNITY)
Admission: EM | Admit: 2023-07-08 | Discharge: 2023-07-09 | Disposition: A | Discharge status: Home / Self Care | Payer: Self-pay | Attending: Emergency Medicine | Admitting: Emergency Medicine

## 2023-07-08 DIAGNOSIS — I10 Essential (primary) hypertension: Secondary | ICD-10-CM | POA: Insufficient documentation

## 2023-07-08 DIAGNOSIS — L02419 Cutaneous abscess of limb, unspecified: Secondary | ICD-10-CM

## 2023-07-08 DIAGNOSIS — Z79899 Other long term (current) drug therapy: Secondary | ICD-10-CM | POA: Insufficient documentation

## 2023-07-08 DIAGNOSIS — L02411 Cutaneous abscess of right axilla: Secondary | ICD-10-CM | POA: Insufficient documentation

## 2023-07-08 NOTE — ED Triage Notes (Signed)
Patient reports skin abscess at right lower axilla with swelling/drainage onset 2 weeks ago . No fever or chills .

## 2023-07-09 MED ORDER — LIDOCAINE-EPINEPHRINE (PF) 2 %-1:200000 IJ SOLN
10.0000 mL | Freq: Once | INTRAMUSCULAR | Status: AC
  Administered 2023-07-09: 10 mL
  Filled 2023-07-09: qty 20

## 2023-07-09 MED ORDER — DOXYCYCLINE HYCLATE 100 MG PO CAPS: 100.0000 mg | ORAL_CAPSULE | Freq: Two times a day (BID) | ORAL | 0 refills | Status: DC

## 2023-07-09 MED ORDER — IBUPROFEN 800 MG PO TABS
800.0000 mg | ORAL_TABLET | Freq: Once | ORAL | Status: AC
  Administered 2023-07-09: 800 mg via ORAL
  Filled 2023-07-09: qty 1

## 2023-07-09 NOTE — ED Provider Notes (Signed)
[place] EMERGENCY DEPARTMENT AT [facility] Provider Note   CSN: [id number] Arrival date & time: 07/08/23  1948     History  Chief Complaint  Patient presents with   Abscess ( Right Axilla)     Melanie Hunt is a 37 y.o. female with history of hypertension, who presents to the emergency department complaining of swelling underneath her right axilla.  Patient states that she initially had a bump under her right axilla for the past 2 weeks.  It started getting more painful, and in the past day or so it turned yellow/green in color and was draining.  Denies any fevers or chills.  Denies any history of prior abscesses.  HPI     Home Medications Prior to Admission medications   Medication Sig Start Date End Date Taking? Authorizing Provider  doxycycline (VIBRAMYCIN) 100 MG capsule Take 1 capsule (100 mg total) by mouth 2 (two) times daily. 07/09/23  Yes Gladiola Madore T, PA-C  albuterol (VENTOLIN HFA) 108 (90 Base) MCG/ACT inhaler Inhale 2 puffs into the lungs every 6 (six) hours as needed for wheezing or shortness of breath. 05/03/23   Raspet, Noberto Retort, PA-C  hydrochlorothiazide (HYDRODIURIL) 25 MG tablet Take 1 tablet (25 mg total) by mouth daily. 02/19/23   Anders Simmonds, PA-C  lisinopril (ZESTRIL) 20 MG tablet Take 1 tablet (20 mg total) by mouth daily. 02/19/23   Anders Simmonds, PA-C  mupirocin ointment (BACTROBAN) 2 % Apply 1 Application topically 2 (two) times daily. 05/07/23   Coralyn Mark, NP  predniSONE (STERAPRED UNI-PAK 21 TAB) 10 MG (21) TBPK tablet As directed 05/03/23   Raspet, Erin K, PA-C  promethazine-dextromethorphan (PROMETHAZINE-DM) 6.25-15 MG/5ML syrup Take 5 mLs by mouth 4 (four) times daily as needed for cough. 05/03/23   Raspet, Noberto Retort, PA-C      Allergies    Patient has no known allergies.    Review of Systems   Review of Systems  Skin:        Abscess  All other systems reviewed and are negative.   Physical Exam Updated Vital  Signs BP 130/76   Pulse 94   Temp 98.4 F (36.9 C) (Oral)   Resp 18   SpO2 100%  Physical Exam Vitals and nursing note reviewed.  Constitutional:      Appearance: Normal appearance.  HENT:     Head: Normocephalic and atraumatic.  Eyes:     Conjunctiva/sclera: Conjunctivae normal.  Pulmonary:     Effort: Pulmonary effort is normal. No respiratory distress.  Skin:    General: Skin is warm and dry.     Comments: Area of fluctuance under the right axilla with purulent drainage, measuring about 3.5 cm in diameter  Neurological:     Mental Status: She is alert.  Psychiatric:        Mood and Affect: Mood normal.        Behavior: Behavior normal.     ED Results / Procedures / Treatments   Labs (all labs ordered are listed, but only abnormal results are displayed) Labs Reviewed - No data to display  EKG None  Radiology No results found.  Procedures .Marland KitchenIncision and Drainage  Date/Time: 07/09/2023 3:20 AM  Performed by: Su Monks, PA-C Authorized by: Su Monks, PA-C   Consent:    Consent obtained:  Verbal   Consent given by:  Patient   Risks, benefits, and alternatives were discussed: yes     Risks discussed:  Bleeding, incomplete  drainage and pain Universal protocol:    Procedure explained and questions answered to patient or proxy's satisfaction: yes     Patient identity confirmed:  Verbally with patient Location:    Type:  Abscess   Size:  3.5 cm   Location:  Upper extremity   Upper extremity location: Right axilla. Pre-procedure details:    Skin preparation:  Povidone-iodine Sedation:    Sedation type:  None Anesthesia:    Anesthesia method:  Local infiltration   Local anesthetic:  Lidocaine 2% WITH epi Procedure type:    Complexity:  Complex Procedure details:    Needle aspiration: yes     Needle size:  25 G   Incision types:  Single straight   Incision depth:  Dermal   Wound management:  Probed and deloculated and irrigated with  saline   Drainage:  Bloody and purulent   Drainage amount:  Moderate   Wound treatment:  Wound left open   Packing materials:  1/2 in gauze   Amount 1/2":  6 Post-procedure details:    Procedure completion:  Tolerated well, no immediate complications     Medications Ordered in ED Medications  ibuprofen (ADVIL) tablet 800 mg (800 mg Oral Given 07/09/23 0240)  lidocaine-EPINEPHrine (XYLOCAINE W/EPI) 2 %-1:200000 (PF) injection 10 mL (10 mLs Infiltration Given 07/09/23 0258)    ED Course/ Medical Decision Making/ A&P                                 Medical Decision Making Risk Prescription drug management.   Patient is a 37 y.o. female who presents to the emergency department for an abscess.  Physical exam: 3.5 cm area of induration under the right axilla consistent with abscess  Procedure: Abscess was incised and drained. Area was anesthestized with lidocaine with epi. Patient tolerated procedure well with no immediate complications. Wound packed with gauze.   Disposition: Patient is not requiring admission or inpatient treatment further symptoms.  Patient was placed on course of antibiotics and instructed to have a wound check in 3 days.  We discussed reasons to return to the emergency department, and patient is agreeable to the plan.  Final Clinical Impression(s) / ED Diagnoses Final diagnoses:  Axillary abscess    Rx / DC Orders ED Discharge Orders          Ordered    doxycycline (VIBRAMYCIN) 100 MG capsule  2 times daily        07/09/23 0319           Portions of this report may have been transcribed using voice recognition software. Every effort was made to ensure accuracy; however, inadvertent computerized transcription errors may be present.    Jeanella Flattery 07/09/23 0322    Dione Booze, MD 07/09/23 [phone]

## 2023-07-09 NOTE — Discharge Instructions (Addendum)
You were seen in the emergency department for an abscess.  We have drained the area and cleaned it. I would like you to have the wound checked in 2-3 days. This can be done by any doctor's office, urgent care, or emergency department. This is to make sure the area hasn't closed too soon. Try to keep the area as clean and dry as possible. It is okay to let warm soapy water run over the area, but do NOT scrub the area. Leave the packing in until your wound check. If the packing comes out prior to that, go ahead and take it all out. Do not try to put this back in.   I am placing you on a course of antibiotics. It is important you finish the entire course! You can take ibuprofen or tylenol as needed for pain.   You can use an antiseptic (chlorhexidine) soap from the pharmacy 1-2 x per month in the areas where abscesses are most likely to form (armpits, buttocks, groin). This soap can dry your skin out so use it sparingly. Once do so once this area has fully healed.   Continue to monitor how you're doing and return to the ER for new or worsening symptoms.

## 2023-07-18 ENCOUNTER — Ambulatory Visit: Payer: Self-pay | Admitting: Primary Care

## 2023-07-18 VITALS — BP 135/91 | HR 96 | Wt 258.8 lb

## 2023-07-18 DIAGNOSIS — Z09 Encounter for follow-up examination after completed treatment for conditions other than malignant neoplasm: Secondary | ICD-10-CM

## 2023-07-18 DIAGNOSIS — L02411 Cutaneous abscess of right axilla: Secondary | ICD-10-CM

## 2023-07-18 MED ORDER — FLUCONAZOLE 150 MG PO TABS
150.0000 mg | ORAL_TABLET | Freq: Once | ORAL | 0 refills | Status: AC
Start: 2023-07-18 — End: 2023-07-18

## 2023-07-18 NOTE — Progress Notes (Unsigned)
abscess

## 2023-10-14 ENCOUNTER — Ambulatory Visit (INDEPENDENT_AMBULATORY_CARE_PROVIDER_SITE_OTHER): Payer: Self-pay | Admitting: Primary Care

## 2023-10-14 NOTE — Telephone Encounter (Signed)
Copied from CRM [id number]. Topic: Clinical - Red Word Triage >> Oct 14, 2023 10:01 AM Shon Hale wrote: Red Word that prompted transfer to Nurse Triage: Coughing, wheezing, shortness of breath.  Chief Complaint: SOB at times Symptoms: Dry cough turns coughing spell then produces wheezing and SOB about, 4 times per day: pt noticed it started after having COVID, not able to have productive sleep Frequency: ongoing 1 year and worsening Pertinent Negatives: Patient denies lightheadedness or dizzy Disposition: [] ED /[] Urgent Care (no appt availability in office) / [x] Appointment(In office/virtual)/ []  [facility]/ [] Home Care/ [] Refused Recommended Disposition /[] [facility]/ []  Follow-up with PCP Additional Notes: Ongoing since last year - also BP medication with SE of coughing - recently stopped taking this medication and still having cough, taking 3 sleep pills per night to sleep  Reason for Disposition  [1] MILD longstanding difficulty breathing AND [2]  SAME as normal  Answer Assessment - Initial Assessment Questions 1. RESPIRATORY STATUS: "Describe your breathing?" (e.g., wheezing, shortness of breath, unable to speak, severe coughing)      Dry cough turns coughing spell then produces wheezing and SOB about 4 times per day: pt noticed it started after having COVID 2. ONSET: "When did this breathing problem begin?"      1 year 3. PATTERN "Does the difficult breathing come and go, or has it been constant since it started?"      Only after coughing spells: using inhaler without relief 4. SEVERITY: "How bad is your breathing?" (e.g., mild, moderate, severe)    - MILD: No SOB at rest, mild SOB with walking, speaks normally in sentences, can lie down, no retractions, pulse < 100.    - MODERATE: SOB at rest, SOB with minimal exertion and prefers to sit, cannot lie down flat, speaks in phrases, mild retractions, audible wheezing, pulse 100-120.    - SEVERE: Very SOB at rest,  speaks in single words, struggling to breathe, sitting hunched forward, retractions, pulse > 120      Mild SOB 5. RECURRENT SYMPTOM: "Have you had difficulty breathing before?" If Yes, ask: "When was the last time?" and "What happened that time?"      Ongoing since last year - also BP medication with SE of coughing - recently stopped taking this medication and still having cough 6. CARDIAC HISTORY: "Do you have any history of heart disease?" (e.g., heart attack, angina, bypass surgery, angioplasty)      no 7. LUNG HISTORY: "Do you have any history of lung disease?"  (e.g., pulmonary embolus, asthma, emphysema)     no 8. CAUSE: "What do you think is causing the breathing problem?"      unknown 9. OTHER SYMPTOMS: "Do you have any other symptoms? (e.g., dizziness, runny nose, cough, chest pain, fever)     Cough 10. O2 SATURATION MONITOR:  "Do you use an oxygen saturation monitor (pulse oximeter) at home?" If Yes, ask: "What is your reading (oxygen level) today?" "What is your usual oxygen saturation reading?" (e.g., 95%)       N/a 11. PREGNANCY: "Is there any chance you are pregnant?" "When was your last menstrual period?"       N/a 12. TRAVEL: "Have you traveled out of the country in the last month?" (e.g., travel history, exposures)       N/a  Protocols used: Breathing Difficulty-A-AH

## 2023-10-21 ENCOUNTER — Encounter (INDEPENDENT_AMBULATORY_CARE_PROVIDER_SITE_OTHER): Payer: Self-pay | Admitting: Primary Care

## 2023-10-21 ENCOUNTER — Ambulatory Visit (INDEPENDENT_AMBULATORY_CARE_PROVIDER_SITE_OTHER): Payer: Managed Care, Other (non HMO) | Admitting: Primary Care

## 2023-10-21 VITALS — BP 173/119 | HR 86 | Resp 16 | Ht 67.0 in | Wt 257.2 lb

## 2023-10-21 DIAGNOSIS — G4733 Obstructive sleep apnea (adult) (pediatric): Secondary | ICD-10-CM

## 2023-10-21 DIAGNOSIS — I10 Essential (primary) hypertension: Secondary | ICD-10-CM | POA: Diagnosis not present

## 2023-10-21 DIAGNOSIS — Z131 Encounter for screening for diabetes mellitus: Secondary | ICD-10-CM

## 2023-10-21 DIAGNOSIS — Z2821 Immunization not carried out because of patient refusal: Secondary | ICD-10-CM | POA: Diagnosis not present

## 2023-10-21 DIAGNOSIS — Z13220 Encounter for screening for lipoid disorders: Secondary | ICD-10-CM

## 2023-10-21 MED ORDER — LISINOPRIL 20 MG PO TABS
20.0000 mg | ORAL_TABLET | Freq: Every day | ORAL | 1 refills | Status: DC
Start: 2023-10-21 — End: 2024-03-30

## 2023-10-21 MED ORDER — HYDROCHLOROTHIAZIDE 25 MG PO TABS: 25.0000 mg | ORAL_TABLET | Freq: Every day | ORAL | 3 refills | Status: DC

## 2023-10-21 NOTE — Patient Instructions (Signed)
Hypertension, Adult Hypertension is another name for high blood pressure. High blood pressure forces your heart to work harder to pump blood. This can cause problems over time. There are two numbers in a blood pressure reading. There is a top number (systolic) over a bottom number (diastolic). It is best to have a blood pressure that is below 120/80. What are the causes? The cause of this condition is not known. Some other conditions can lead to high blood pressure. What increases the risk? Some lifestyle factors can make you more likely to develop high blood pressure: Smoking. Not getting enough exercise or physical activity. Being overweight. Having too much fat, sugar, calories, or salt (sodium) in your diet. Drinking too much alcohol. Other risk factors include: Having any of these conditions: Heart disease. Diabetes. High cholesterol. Kidney disease. Obstructive sleep apnea. Having a family history of high blood pressure and high cholesterol. Age. The risk increases with age. Stress. What are the signs or symptoms? High blood pressure may not cause symptoms. Very high blood pressure (hypertensive crisis) may cause: Headache. Fast or uneven heartbeats (palpitations). Shortness of breath. Nosebleed. Vomiting or feeling like you may vomit (nauseous). Changes in how you see. Very bad chest pain. Feeling dizzy. Seizures. How is this treated? This condition is treated by making healthy lifestyle changes, such as: Eating healthy foods. Exercising more. Drinking less alcohol. Your doctor may prescribe medicine if lifestyle changes do not help enough and if: Your top number is above 130. Your bottom number is above 80. Your personal target blood pressure may vary. Follow these instructions at home: Eating and drinking  If told, follow the DASH eating plan. To follow this plan: Fill one half of your plate at each meal with fruits and vegetables. Fill one fourth of your plate  at each meal with whole grains. Whole grains include whole-wheat pasta, brown rice, and whole-grain bread. Eat or drink low-fat dairy products, such as skim milk or low-fat yogurt. Fill one fourth of your plate at each meal with low-fat (lean) proteins. Low-fat proteins include fish, chicken without skin, eggs, beans, and tofu. Avoid fatty meat, cured and processed meat, or chicken with skin. Avoid pre-made or processed food. Limit the amount of salt in your diet to less than 1,500 mg each day. Do not drink alcohol if: Your doctor tells you not to drink. You are pregnant, may be pregnant, or are planning to become pregnant. If you drink alcohol: Limit how much you have to: 0-1 drink a day for women. 0-2 drinks a day for men. Know how much alcohol is in your drink. In the [place] one drink equals one 12 oz bottle of beer (355 mL), one 5 oz glass of wine (148 mL), or one 1 oz glass of hard liquor (44 mL). Lifestyle  Work with your doctor to stay at a healthy weight or to lose weight. Ask your doctor what the best weight is for you. Get at least 30 minutes of exercise that causes your heart to beat faster (aerobic exercise) most days of the week. This may include walking, swimming, or biking. Get at least 30 minutes of exercise that strengthens your muscles (resistance exercise) at least 3 days a week. This may include lifting weights or doing Pilates. Do not smoke or use any products that contain nicotine or tobacco. If you need help quitting, ask your doctor. Check your blood pressure at home as told by your doctor. Keep all follow-up visits. Medicines Take over-the-counter and prescription medicines  only as told by your doctor. Follow directions carefully. Do not skip doses of blood pressure medicine. The medicine does not work as well if you skip doses. Skipping doses also puts you at risk for problems. Ask your doctor about side effects or reactions to medicines that you should watch  for. Contact a doctor if: You think you are having a reaction to the medicine you are taking. You have headaches that keep coming back. You feel dizzy. You have swelling in your ankles. You have trouble with your vision. Get help right away if: You get a very bad headache. You start to feel mixed up (confused). You feel weak or numb. You feel faint. You have very bad pain in your: Chest. Belly (abdomen). You vomit more than once. You have trouble breathing. These symptoms may be an emergency. Get help right away. Call 911. Do not wait to see if the symptoms will go away. Do not drive yourself to the hospital. Summary Hypertension is another name for high blood pressure. High blood pressure forces your heart to work harder to pump blood. For most people, a normal blood pressure is less than 120/80. Making healthy choices can help lower blood pressure. If your blood pressure does not get lower with healthy choices, you may need to take medicine. This information is not intended to replace advice given to you by your health care provider. Make sure you discuss any questions you have with your health care provider. Document Revised: 06/27/2021 Document Reviewed: 06/27/2021 Elsevier Patient Education  2024 [employer].

## 2023-10-21 NOTE — Progress Notes (Signed)
[facility]  Melanie Hunt is a 38 y.o. female presents to office today for annual physical exam examination.    Concerns today include: Shortness of breath.  Occupation: truck , Marital status: S, Substance use: N Diet: N, Exercise: N Patient presents with possible obstructive sleep apnea. Patent has a 10 years history of symptoms of daytime fatigue and hypertension. Patient generally gets 7 or 8 hours of sleep per night, and states they generally have difficulty falling asleep, nightime awakenings, and difficulty falling back asleep if awakened. Snoring of moderate severity is present. Apneic episodes is not present. Nasal obstruction is not present.  Patient has not had tonsillectomy.   Health Maintenance  Topic Date Due   Pneumococcal Vaccine 6-68 Years old (1 of 2 - PCV) Never done   DTaP/Tdap/Td (1 - Tdap) Never done   Cervical Cancer Screening (HPV/Pap Cotest)  01/09/2018   INFLUENZA VACCINE  04/23/2023   COVID-19 Vaccine (3 - 2024-25 season) 05/24/2023   Hepatitis C Screening  Completed   HIV Screening  Completed   HPV VACCINES  Aged Out     Past Medical History:  Diagnosis Date   Acute hypoxemic respiratory failure due to COVID-19 (HCC) 11/04/2019   HTN (hypertension)    Idiopathic angioedema    Social History   Socioeconomic History   Marital status: Single    Spouse name: Not on file   Number of children: 0   Years of education: Not on file   Highest education level: Not on file  Occupational History   Occupation: warehouse  Tobacco Use   Smoking status: Never   Smokeless tobacco: Never  Vaping Use   Vaping status: Never Used  Substance and Sexual Activity   Alcohol use: Yes    Comment: 3 to 4 times per week.   Drug use: Never   Sexual activity: Yes    Birth control/protection: I.U.D.  Other Topics Concern   Not on file  Social History Narrative   Not on file   Social Drivers of Health   Financial Resource Strain: Not on file   Food Insecurity: Not on file  Transportation Needs: Not on file  Physical Activity: Not on file  Stress: Not on file  Social Connections: Not on file  Intimate Partner Violence: Not on file   Past Surgical History:  Procedure Laterality Date   UTERINE ABLATION     Family History  Problem Relation Age of Onset   Hypertension Mother    Hypertension Maternal Uncle    Hypertension Maternal Grandmother    Esophageal cancer Neg Hx    Colon cancer Neg Hx    Rectal cancer Neg Hx    Stomach cancer Neg Hx     Current Outpatient Medications:    albuterol (VENTOLIN HFA) 108 (90 Base) MCG/ACT inhaler, Inhale 2 puffs into the lungs every 6 (six) hours as needed for wheezing or shortness of breath., Disp: 8 g, Rfl: 0   doxycycline (VIBRAMYCIN) 100 MG capsule, Take 1 capsule (100 mg total) by mouth 2 (two) times daily., Disp: 20 capsule, Rfl: 0   hydrochlorothiazide (HYDRODIURIL) 25 MG tablet, Take 1 tablet (25 mg total) by mouth daily., Disp: 90 tablet, Rfl: 3   lisinopril (ZESTRIL) 20 MG tablet, Take 1 tablet (20 mg total) by mouth daily., Disp: 90 tablet, Rfl: 1   mupirocin ointment (BACTROBAN) 2 %, Apply 1 Application topically 2 (two) times daily., Disp: 15 g, Rfl: 0   predniSONE (STERAPRED UNI-PAK 21 TAB) 10 MG (  21) TBPK tablet, As directed, Disp: 21 tablet, Rfl: 0   promethazine-dextromethorphan (PROMETHAZINE-DM) 6.25-15 MG/5ML syrup, Take 5 mLs by mouth 4 (four) times daily as needed for cough., Disp: 118 mL, Rfl: 0 Outpatient Encounter Medications as of 10/21/2023  Medication Sig   albuterol (VENTOLIN HFA) 108 (90 Base) MCG/ACT inhaler Inhale 2 puffs into the lungs every 6 (six) hours as needed for wheezing or shortness of breath.   doxycycline (VIBRAMYCIN) 100 MG capsule Take 1 capsule (100 mg total) by mouth 2 (two) times daily.   hydrochlorothiazide (HYDRODIURIL) 25 MG tablet Take 1 tablet (25 mg total) by mouth daily.   lisinopril (ZESTRIL) 20 MG tablet Take 1 tablet (20 mg total) by  mouth daily.   mupirocin ointment (BACTROBAN) 2 % Apply 1 Application topically 2 (two) times daily.   predniSONE (STERAPRED UNI-PAK 21 TAB) 10 MG (21) TBPK tablet As directed   promethazine-dextromethorphan (PROMETHAZINE-DM) 6.25-15 MG/5ML syrup Take 5 mLs by mouth 4 (four) times daily as needed for cough.   No facility-administered encounter medications on file as of 10/21/2023.    No Known Allergies   ROS: Review of Systems Pertinent items noted in HPI and remainder of comprehensive ROS otherwise negative.    Physical exam BP (!) 173/119   Pulse 86   Resp 16   Ht 5\' 7"  (1.702 m)   Wt 257 lb 3.2 oz (116.7 kg)   SpO2 97%   BMI 40.28 kg/m  General appearance: alert, cooperative, and morbidly obese Eyes: conjunctivae/corneas clear. PERRL, EOM's intact. Fundi benign. Neck: no adenopathy, no carotid bruit, no JVD, supple, symmetrical, trachea midline, and thyroid not enlarged, symmetric, no tenderness/mass/nodules Lungs: clear to auscultation bilaterally Breasts: deferred Abdomen: soft, non-tender; bowel sounds normal; no masses,  no organomegaly Extremities: extremities normal, atraumatic, no cyanosis or edema Skin: Skin color, texture, turgor normal. No rashes or lesions Lymph nodes: Cervical, supraclavicular, and axillary nodes normal. Neurologic: Alert and oriented X 3, normal strength and tone. Normal symmetric reflexes. Normal coordination and gait    Assessment/ Plan: Melanie Hunt here for annual physical exam.  Melanie Hunt was seen today for hypertension.  Diagnoses and all orders for this visit:  Essential hypertension BP goal - < 130/80 Explained that having normal blood pressure is the goal and medications are helping to get to goal and maintain normal blood pressure. DIET: Limit salt intake, read nutrition labels to check salt content, limit fried and high fatty foods  Avoid using multisymptom OTC cold preparations that generally contain sudafed which can rise BP.  Consult with pharmacist on best cold relief products to use for persons with HTN EXERCISE Discussed incorporating exercise such as walking - 30 minutes most days of the week and can do in 10 minute intervals    -     CBC with Differential/Platelet -     CMP14+EGFR -     hydrochlorothiazide (HYDRODIURIL) 25 MG tablet; Take 1 tablet (25 mg total) by mouth daily. -     lisinopril (ZESTRIL) 20 MG tablet; Take 1 tablet (20 mg total) by mouth daily.  Diabetes mellitus screening -     Hemoglobin A1c  Screening cholesterol level -     Lipid panel  Pneumococcal vaccination declined  Tetanus, diphtheria, and acellular pertussis (Tdap) vaccination declined  Influenza vaccination declined  OSA (obstructive sleep apnea) -     Ambulatory referral to Pulmonology    Counseled on healthy lifestyle choices, including diet (rich in fruits, vegetables and lean meats and low in salt  and simple carbohydrates) and exercise (at least 30 minutes of moderate physical activity daily).  Patient to follow up in 1 year for annual exam or sooner if needed.  The above assessment and management plan was discussed with the patient. The patient verbalized understanding of and has agreed to the management plan. Patient is aware to call the clinic if symptoms persist or worsen. Patient is aware when to return to the clinic for a follow-up visit. Patient educated on when it is appropriate to go to the emergency department.   This note has been created with Education officer, environmental. Any transcriptional errors are unintentional.   Grayce Sessions, NP 10/21/2023, 3:45 PM

## 2023-10-22 LAB — CMP14+EGFR
ALT: 25 [IU]/L (ref 0–32)
AST: 23 [IU]/L (ref 0–40)
Albumin: 4.6 g/dL (ref 3.9–4.9)
Alkaline Phosphatase: 100 [IU]/L (ref 44–121)
BUN/Creatinine Ratio: 14 (ref 9–23)
BUN: 17 mg/dL (ref 6–20)
Bilirubin Total: 0.5 mg/dL (ref 0.0–1.2)
CO2: 23 mmol/L (ref 20–29)
Calcium: 9.5 mg/dL (ref 8.7–10.2)
Chloride: 100 mmol/L (ref 96–106)
Creatinine, Ser: 1.2 mg/dL — ABNORMAL HIGH (ref 0.57–1.00)
Globulin, Total: 2.7 g/dL (ref 1.5–4.5)
Glucose: 88 mg/dL (ref 70–99)
Potassium: 3.9 mmol/L (ref 3.5–5.2)
Sodium: 139 mmol/L (ref 134–144)
Total Protein: 7.3 g/dL (ref 6.0–8.5)
eGFR: 60 mL/min/{1.73_m2} (ref >59)

## 2023-10-22 LAB — CBC WITH DIFFERENTIAL/PLATELET
Basophils Absolute: 0 10*3/uL (ref 0.0–0.2)
Basos: 0 %
EOS (ABSOLUTE): 0.2 10*3/uL (ref 0.0–0.4)
Eos: 2 %
Hematocrit: 39.1 % (ref 34.0–46.6)
Hemoglobin: 13.1 g/dL (ref 11.1–15.9)
Immature Grans (Abs): 0 10*3/uL (ref 0.0–0.1)
Immature Granulocytes: 0 %
Lymphocytes Absolute: 3.7 10*3/uL — ABNORMAL HIGH (ref 0.7–3.1)
Lymphs: 42 %
MCH: 27.8 pg (ref 26.6–33.0)
MCHC: 33.5 g/dL (ref 31.5–35.7)
MCV: 83 fL (ref 79–97)
Monocytes Absolute: 0.5 10*3/uL (ref 0.1–0.9)
Monocytes: 6 %
Neutrophils Absolute: 4.3 10*3/uL (ref 1.4–7.0)
Neutrophils: 50 %
Platelets: 327 10*3/uL (ref 150–450)
RBC: 4.71 x10E6/uL (ref 3.77–5.28)
RDW: 14.3 % (ref 11.7–15.4)
WBC: 8.8 10*3/uL (ref 3.4–10.8)

## 2023-10-22 LAB — LIPID PANEL
Chol/HDL Ratio: 6.2 {ratio} — ABNORMAL HIGH (ref 0.0–4.4)
Cholesterol, Total: 248 mg/dL — ABNORMAL HIGH (ref 100–199)
HDL: 40 mg/dL (ref >39)
LDL Chol Calc (NIH): 153 mg/dL — ABNORMAL HIGH (ref 0–99)
Triglycerides: 299 mg/dL — ABNORMAL HIGH (ref 0–149)
VLDL Cholesterol Cal: 55 mg/dL — ABNORMAL HIGH (ref 5–40)

## 2023-10-22 LAB — HEMOGLOBIN A1C
Est. average glucose Bld gHb Est-mCnc: 123 mg/dL
Hgb A1c MFr Bld: 5.9 % — ABNORMAL HIGH (ref 4.8–5.6)

## 2023-10-26 ENCOUNTER — Other Ambulatory Visit (INDEPENDENT_AMBULATORY_CARE_PROVIDER_SITE_OTHER): Payer: Self-pay | Admitting: Primary Care

## 2023-10-26 ENCOUNTER — Encounter (INDEPENDENT_AMBULATORY_CARE_PROVIDER_SITE_OTHER): Payer: Self-pay | Admitting: Primary Care

## 2023-10-26 MED ORDER — ROSUVASTATIN CALCIUM 40 MG PO TABS: 40.0000 mg | ORAL_TABLET | Freq: Every day | ORAL | 3 refills | Status: DC

## 2023-11-11 ENCOUNTER — Ambulatory Visit (INDEPENDENT_AMBULATORY_CARE_PROVIDER_SITE_OTHER): Payer: Managed Care, Other (non HMO)

## 2023-11-16 ENCOUNTER — Ambulatory Visit (INDEPENDENT_AMBULATORY_CARE_PROVIDER_SITE_OTHER): Payer: Managed Care, Other (non HMO) | Admitting: Primary Care

## 2023-11-16 NOTE — Progress Notes (Signed)
   Blood Pressure Recheck Visit  Name: Melanie Hunt MRN: [id number] Date of Birth: 06-Jan-1986  [name] presents today for Blood Pressure recheck with clinical support staff.    BP Readings from Last 3 Encounters:  11/16/23 123/83  10/21/23 (!) 173/119  07/18/23 (!) 135/91    Current Outpatient Medications  Medication Sig Dispense Refill   hydrochlorothiazide (HYDRODIURIL) 25 MG tablet Take 1 tablet (25 mg total) by mouth daily. 90 tablet 3   lisinopril (ZESTRIL) 20 MG tablet Take 1 tablet (20 mg total) by mouth daily. 90 tablet 1   rosuvastatin (CRESTOR) 40 MG tablet Take 1 tablet (40 mg total) by mouth daily. 90 tablet 3   No current facility-administered medications for this visit.    Hypertensive Medication Review: Patient states that they are taking all their hypertensive medications as prescribed and their last dose of hypertensive medications was yesterday   Documentation of any medication adherence discrepancies: none  Provider Recommendation:  Spoke to [place] and she stated: bp is good! Continue medication will see    Patient has been given provider's recommendations and does not have any questions or concerns at this time. Patient will contact the office for any future questions or concerns.

## 2023-12-03 ENCOUNTER — Institutional Professional Consult (permissible substitution): Payer: Managed Care, Other (non HMO) | Admitting: Adult Health

## 2023-12-03 ENCOUNTER — Encounter: Payer: Self-pay | Admitting: Adult Health

## 2023-12-22 ENCOUNTER — Ambulatory Visit: Admitting: Adult Health

## 2023-12-22 ENCOUNTER — Encounter: Payer: Self-pay | Admitting: Adult Health

## 2023-12-22 VITALS — BP 143/88 | HR 101 | Temp 97.1°F | Ht 67.0 in | Wt 260.0 lb

## 2023-12-22 DIAGNOSIS — G4719 Other hypersomnia: Secondary | ICD-10-CM

## 2023-12-22 DIAGNOSIS — R0683 Snoring: Secondary | ICD-10-CM | POA: Diagnosis not present

## 2023-12-22 DIAGNOSIS — I10 Essential (primary) hypertension: Secondary | ICD-10-CM

## 2023-12-22 DIAGNOSIS — G4700 Insomnia, unspecified: Secondary | ICD-10-CM | POA: Diagnosis not present

## 2023-12-22 DIAGNOSIS — E785 Hyperlipidemia, unspecified: Secondary | ICD-10-CM

## 2023-12-22 NOTE — Patient Instructions (Signed)
 Set up for home sleep study Work on healthy weight loss  Do not drive if sleepy  Would avoid Benadryl if possible  Try Melatonin At bedtime As needed  Insomnia Follow up in 4-6 weeks -in person or virtual.

## 2023-12-22 NOTE — Progress Notes (Signed)
 @Patient  ID: Melanie Hunt, female    DOB: 1985-10-25, 38 y.o.   MRN: [id number]  Chief Complaint  Patient presents with   Consult    Referring provider: Grayce Sessions, NP  HPI: 38 year old female seen for sleep consult visit December 22, 2023 for daytime sleepiness  TEST/EVENTS :   12/22/2023 Sleep consult  Discussed the use of AI scribe software for clinical note transcription with the patient, who gave verbal consent to proceed.  History of Present Illness   Melanie Hunt is a 38 year old female who presents with sleep disturbances. She was kindly referred by her primary care provider Grayce Sessions, NP  for evaluation of her sleep issues.  She experiences significant daytime sleepiness and sleep disturbances. She does not have trouble falling asleep but has difficulty staying asleep, often waking up multiple times during the night. Her sleep is restless, and she wakes up feeling exhausted, affecting her ability to function during the day.  She has been taking over-the-counter diphenhydramine, 150 mg nightly, to aid her sleep, but it only provides a few hours of rest before she wakes up again. 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  Her sleep issues have been worsening over the past year. She previously worked night shifts and did not notice significant sleep problems at that time, although she still took diphenhydramine. Her weight has increased compared to five years ago, and she has been told that she snores. She denies smoking, drug, or alcohol use, and lives with her mother. No symptoms suspicious for cataplexy or sleep paralysis.  No major anxiety, depression, or trauma. She reports that her mind 'never cuts off' but does not attribute her sleep issues to stress. No history of diabetes. She has high blood pressure and high cholesterol. A cousin has sleep apnea.    Typically goes to bed elevated can.  Take only a little while to go to sleep.  But is very restless throughout the night.  Wakes up multiple times.  Gets up about 4 AM.  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  Takes 350 mg Benadryl each night to help her sleep.  She has no history of congestive heart failure stroke.  No removable dental work.  Drinks about 1 to 2 cups of caffeine daily. Epworth score is 23 out of 24.  Very sleepy throughout the day.  No matter what she is doing.  Medical history significant for hypertension and hyperlipidemia.  No significant surgical history.  Social history patient is single.  Does not have any children.  Smoker.  No alcohol or drug use.  Lives with her mother.  Is a truck driver.  Family history positive for sleep apnea cousin.       No Known Allergies  Immunization History  Administered Date(s) Administered   Influenza, Seasonal, Injecte, Preservative Fre 07/25/2013   Influenza,inj,Quad PF,6+ Mos 06/08/2014, 06/15/2015, 06/12/2016, 08/20/2020   PFIZER(Purple Top)SARS-COV-2 Vaccination 01/11/2020, 01/25/2020    Past Medical History:  Diagnosis Date   Acute hypoxemic respiratory failure due to COVID-19 (HCC) 11/04/2019   HTN (hypertension)    Idiopathic angioedema     Tobacco History: Social History   Tobacco Use  Smoking Status Never  Smokeless Tobacco Never   Counseling given: Not Answered   Outpatient Medications Prior to Visit  Medication Sig Dispense Refill   hydrochlorothiazide (HYDRODIURIL) 25 MG tablet Take 1 tablet (25  mg total) by mouth daily. 90 tablet 3   lisinopril (ZESTRIL) 20 MG tablet Take 1 tablet (20 mg total) by mouth daily. 90 tablet 1   rosuvastatin (CRESTOR) 40 MG tablet Take 1 tablet (40 mg total) by mouth daily. 90 tablet 3   No facility-administered medications prior to visit.     Review of Systems:   Constitutional:   No  weight loss, night sweats,  Fevers, chills, +fatigue, or  lassitude.  HEENT:   No  headaches,  Difficulty swallowing,  Tooth/dental problems, or  Sore throat,                No sneezing, itching, ear ache, nasal congestion, post nasal drip,   CV:  No chest pain,  Orthopnea, PND, swelling in lower extremities, anasarca, dizziness, palpitations, syncope.   GI  No heartburn, indigestion, abdominal pain, nausea, vomiting, diarrhea, change in bowel habits, loss of appetite, bloody stools.   Resp: No shortness of breath with exertion or at rest.  No excess mucus, no productive cough,  No non-productive cough,  No coughing up of blood.  No change in color of mucus.  No wheezing.  No chest wall deformity  Skin: no rash or lesions.  GU: no dysuria, change in color of urine, no urgency or frequency.  No flank pain, no hematuria   MS:  No joint pain or swelling.  No decreased range of motion.  No back pain.    Physical Exam  BP (!) 143/88   Pulse (!) 101   Temp (!) 97.1 F (36.2 C)   Ht 5\' 7"  (1.702 m)   Wt 260 lb (117.9 kg)   SpO2 98%   BMI 40.72 kg/m   GEN: A/Ox3; pleasant , NAD, well nourished    HEENT:  [place]/AT,   NOSE-clear, THROAT-clear, no lesions, no postnasal drip or exudate noted. Class 3 MP airway   NECK:  Supple w/ fair ROM; no JVD; normal carotid impulses w/o bruits; no thyromegaly or nodules palpated; no lymphadenopathy.    RESP  Clear  P & A; w/o, wheezes/ rales/ or rhonchi. no accessory muscle use, no dullness to percussion  CARD:  RRR, no m/r/g, no peripheral edema, pulses intact, no cyanosis or clubbing.  GI:   Soft & nt; nml bowel sounds; no organomegaly or masses detected.   Musco: Warm bil, no deformities or joint swelling noted.   Neuro: alert, no focal deficits noted.    Skin: Warm, no lesions or rashes    Lab Results:  CBC     BNP No results found for: "BNP"  ProBNP No results found for: "PROBNP"  Imaging: No results found.  Administration History     None           No data to display          No results  found for: "NITRICOXIDE"      Assessment & Plan:   Assessment and Plan    Suspected Obstructive Sleep Apnea (OSA)   She presents with symptoms indicative of OSA, such as excessive daytime sleepiness, snoring, and fragmented sleep. 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 Symptoms have worsened over the past year. High doses of diphenhydramine likely worsen daytime sleepiness due to its sedative effects. Her BMI and history of snoring further support the suspicion of OSA. Untreated OSA poses risks of cardiovascular complications, metabolic issues, and increased accident risk, particularly concerning given her occupation. Untreated OSA  can lead to hypertension, heart arrhythmias, congestive heart failure, stroke, and weight gain. Order a home sleep study to evaluate for OSA. Advise discontinuing diphenhydramine and suggest a trial of melatonin for sleep. Recommend lifestyle modifications, including increased physical activity and stress reduction techniques. Discuss the potential need for CPAP therapy if OSA is confirmed. Educate on the implications of OSA on CDL licensure and the importance of compliance with treatment if diagnosed.  Chronic Insomnia   She experiences difficulty maintaining sleep, leading to fragmented sleep and daytime fatigue. Chronic use of diphenhydramine has not improved sleep quality and may contribute to symptoms. Insomnia may be secondary to suspected sleep apnea or a primary condition exacerbated by poor sleep hygiene and stress. Excessive diphenhydramine use can cause daytime hangover effects and impact long-term memory. Recommend sleep hygiene improvements, including reducing screen time before bed and engaging in relaxing activities. Suggest a trial of melatonin as a safer alternative to diphenhydramine. Consider further evaluation and treatment for insomnia if sleep apnea is ruled out.  Hypertension    Hypertension may be exacerbated by untreated sleep apnea. Effective management of sleep apnea may aid in better control of blood pressure. Monitor blood pressure and assess control in the context of potential sleep apnea diagnosis and treatment.  Hyperlipidemia   Hyperlipidemia is important to consider in the context of overall cardiovascular risk, especially if sleep apnea is confirmed. Continue management of hyperlipidemia as per primary care recommendations.  Morbid obesity.  Continue with healthy weight loss  Follow-up   Follow-up is necessary to review the results of the sleep study and discuss further management based on findings. If the sleep study is negative, further evaluation with an in-lab study and a multiple sleep latency test may be needed to rule out narcolepsy. Schedule a follow-up appointment in 4-6 weeks to review sleep study results and discuss treatment options. Offer virtual follow-up if more convenient.         Rubye Oaks, NP 12/22/2023

## 2023-12-29 ENCOUNTER — Encounter

## 2023-12-29 DIAGNOSIS — R0683 Snoring: Secondary | ICD-10-CM

## 2024-01-14 DIAGNOSIS — R069 Unspecified abnormalities of breathing: Secondary | ICD-10-CM | POA: Diagnosis not present

## 2024-02-18 ENCOUNTER — Telehealth: Admitting: Adult Health

## 2024-02-18 ENCOUNTER — Encounter: Payer: Self-pay | Admitting: Adult Health

## 2024-02-18 DIAGNOSIS — R0683 Snoring: Secondary | ICD-10-CM

## 2024-02-18 DIAGNOSIS — G4733 Obstructive sleep apnea (adult) (pediatric): Secondary | ICD-10-CM

## 2024-02-18 DIAGNOSIS — G4700 Insomnia, unspecified: Secondary | ICD-10-CM | POA: Diagnosis not present

## 2024-02-18 NOTE — Patient Instructions (Addendum)
 Begin CPAP At bedtime, wear all night long for at least 6hr each night  Work on healthy weight loss -can discuss with Primary provider to see if you are a candidate for Weight loss drug -Zepbound that has been approved for sleep apnea patients.  Do not drive if sleepy  Would avoid Benadryl if possible  Use caution with sedating medications  Melatonin At bedtime As needed  Insomnia Follow up in 3 months -in person or virtual.

## 2024-02-18 NOTE — Progress Notes (Signed)
 Virtual Visit via Video Note  I connected with Melanie Hunt on 02/18/24 at  3:00 PM EDT by a video enabled telemedicine application and verified that I am speaking with the correct person using two identifiers.  Location: Patient: Home  Provider: Office    I discussed the limitations of evaluation and management by telemedicine and the availability of in person appointments. The patient expressed understanding and agreed to proceed.  History of Present Illness: 38 year old female seen for sleep consult December 22, 2023 for insomnia and daytime sleepiness.  Found to have moderate obstructive sleep apnea  Today's video visit is discussed sleep study results.  Patient was seen in December 22, 2023 for insomnia and significant daytime sleepiness.  She was set up for a home sleep study that was done on January 18, 2024 that showed moderate sleep apnea with AHI at 15.2/hour and SpO2 low at 72%.  We discussed her sleep study results in detail.  Apnea.  Patient will proceed with CPAP therapy.  We also discussed weight loss.  Discussed Zepbound approval for sleep apnea patients with obesity..  Patient education given on CPAP.   Observations/Objective:  Appears well no acute distress  Assessment and Plan: Moderate obstructive sleep apnea.  We discussed sleep study results in detail.  Patient occasion given on sleep apnea.  We went over treatment options.  Patient begin CPAP therapy auto CPAP 5 to 15 cm H2O.  - discussed how weight can impact sleep and risk for sleep disordered breathing - discussed options to assist with weight loss: combination of diet modification, cardiovascular and strength training exercises   - had an extensive discussion regarding the adverse health consequences related to untreated sleep disordered breathing - specifically discussed the risks for hypertension, coronary artery disease, cardiac dysrhythmias, cerebrovascular disease, and diabetes - lifestyle modification discussed    - discussed how sleep disruption can increase risk of accidents, particularly when driving - safe driving practices were discussed    Morbid obesity.  Healthy weight loss discussed in detail.  Can discuss with primary care if Zepbound would be an option to help with ongoing weight loss journey.  Insomnia.  Healthy sleep regimen discussed in detail.  Avoid sedating medications if possible.  Try melatonin if needed.  Avoid Benadryl if possible.  Plan  Patient Instructions  Begin CPAP At bedtime, wear all night long for at least 6hr each night  Work on healthy weight loss -can discuss with Primary provider to see if you are a candidate for Weight loss drug -Zepbound that has been approved for sleep apnea patients.  Do not drive if sleepy  Would avoid Benadryl if possible  Use caution with sedating medications  Melatonin At bedtime As needed  Insomnia Follow up in 3 months -in person or virtual.     Follow Up Instructions:    I discussed the assessment and treatment plan with the patient. The patient was provided an opportunity to ask questions and all were answered. The patient agreed with the plan and demonstrated an understanding of the instructions.   The patient was advised to call back or seek an in-person evaluation if the symptoms worsen or if the condition fails to improve as anticipated.  I provided 22 minutes of non-face-to-face time during this encounter.   Roena Clark, NP

## 2024-03-10 NOTE — Telephone Encounter (Signed)
 Any update on this?

## 2024-03-21 ENCOUNTER — Encounter: Payer: Self-pay | Admitting: *Deleted

## 2024-03-24 ENCOUNTER — Ambulatory Visit (INDEPENDENT_AMBULATORY_CARE_PROVIDER_SITE_OTHER): Admitting: Primary Care

## 2024-03-30 ENCOUNTER — Ambulatory Visit (INDEPENDENT_AMBULATORY_CARE_PROVIDER_SITE_OTHER): Admitting: Primary Care

## 2024-03-30 ENCOUNTER — Encounter (INDEPENDENT_AMBULATORY_CARE_PROVIDER_SITE_OTHER): Payer: Self-pay | Admitting: Primary Care

## 2024-03-30 VITALS — BP 137/89 | HR 86 | Resp 16

## 2024-03-30 DIAGNOSIS — Z975 Presence of (intrauterine) contraceptive device: Secondary | ICD-10-CM

## 2024-03-30 DIAGNOSIS — N921 Excessive and frequent menstruation with irregular cycle: Secondary | ICD-10-CM | POA: Diagnosis not present

## 2024-03-30 DIAGNOSIS — Z13220 Encounter for screening for lipoid disorders: Secondary | ICD-10-CM

## 2024-03-30 DIAGNOSIS — I10 Essential (primary) hypertension: Secondary | ICD-10-CM | POA: Diagnosis not present

## 2024-03-30 DIAGNOSIS — Z131 Encounter for screening for diabetes mellitus: Secondary | ICD-10-CM | POA: Diagnosis not present

## 2024-03-30 NOTE — Progress Notes (Signed)
 [facility]  Melanie Hunt, is a 38 y.o. female  RDW:[id number]  FMW:[id number]  DOB - 11-26-1985  Chief Complaint  Patient presents with   Cough    Had it for over year Pt states she has mentioned it previously to provider    Hematuria    Pt states she had her urine tested 02/2024 and she was told she has blood in her    Referral    Gyn for iud        Subjective:   Melanie Hunt is a 38 y.o. female here today for an acute visit. Concerned about a cough for at least a year . She is on Lisinopril  will d/c and change to another BP med.  Cough  Hematuria    No problems updated.  Comprehensive ROS Pertinent positive and negative noted in HPI   No Known Allergies  Past Medical History:  Diagnosis Date   Acute hypoxemic respiratory failure due to COVID-19 (HCC) 11/04/2019   HTN (hypertension)    Idiopathic angioedema     Current Outpatient Medications on File Prior to Visit  Medication Sig Dispense Refill   hydrochlorothiazide  (HYDRODIURIL ) 25 MG tablet Take 1 tablet (25 mg total) by mouth daily. 90 tablet 3   rosuvastatin  (CRESTOR ) 40 MG tablet Take 1 tablet (40 mg total) by mouth daily. 90 tablet 3   No current facility-administered medications on file prior to visit.   Health Maintenance  Topic Date Due   Hepatitis B Vaccine (1 of 3 - 19+ 3-dose series) Never done   HPV Vaccine (1 - 3-dose SCDM series) Never done   Pap with HPV screening  01/09/2018   COVID-19 Vaccine (3 - 2024-25 season) 05/24/2023   DTaP/Tdap/Td vaccine (1 - Tdap) 10/20/2024*   Pneumococcal Vaccination (1 of 2 - PCV) 10/20/2024*   Flu Shot  04/22/2024   Hepatitis C Screening  Completed   HIV Screening  Completed   Meningitis B Vaccine  Aged Out  *Topic was postponed. The date shown is not the original due date.    Objective:  BP 137/89   Pulse 86   Resp 16   SpO2 99%   Physical Exam Constitutional:      Appearance: She is obese.  HENT:     Right Ear: External ear  normal.     Left Ear: External ear normal.     Nose: Nose normal.  Cardiovascular:     Rate and Rhythm: Normal rate and regular rhythm.  Pulmonary:     Effort: Pulmonary effort is normal.     Breath sounds: Normal breath sounds.  Musculoskeletal:        General: Normal range of motion.     Cervical back: Normal range of motion.  Skin:    General: Skin is warm and dry.  Neurological:     Mental Status: She is alert and oriented to person, place, and time.     Assessment & Plan  Melanie Hunt was seen today for cough, hematuria and referral.  Diagnoses and all orders for this visit:  IUD (intrauterine device) in place -     Ambulatory referral to Gynecology  Metrorrhagia -     Ambulatory referral to Gynecology -     CBC with Differential/Platelet; Future  Essential hypertension -     CBC with Differential/Platelet; Future -     CMP14+EGFR; Future -     Lipid panel; Future -     carvedilol  (COREG ) 12.5 MG tablet; Take 1 tablet (12.5  mg total) by mouth 2 (two) times daily with a meal.  Diabetes mellitus screening -     Hemoglobin A1c; Future  Lipid screening -     Lipid panel; Future    Patient have been counseled extensively about nutrition and exercise. Other issues discussed during this visit include: low cholesterol diet, weight control and daily exercise, foot care, annual eye examinations at Ophthalmology, importance of adherence with medications and regular follow-up. We also discussed long term complications of uncontrolled diabetes and hypertension.   Return in about 3 weeks (around 04/20/2024).  The patient was given clear instructions to go to ER or return to medical center if symptoms don't improve, worsen or new problems develop. The patient verbalized understanding. The patient was told to call to get lab results if they haven't heard anything in the next week.   This note has been created with Education officer, environmental. Any  transcriptional errors are unintentional.   Melanie SHAUNNA Bohr, NP 04/04/2024, 1:03 AM

## 2024-03-30 NOTE — Patient Instructions (Signed)
 Carvedilol  Tablets What is this medication? CARVEDILOL  (KAR ve dil ol) treats high blood pressure and heart failure. It may also be used to prevent further damage after a heart attack. It works by lowering your blood pressure and heart rate, making it easier for your heart to pump blood to the rest of your body. It belongs to a group of medications called beta blockers. This medicine may be used for other purposes; ask your health care provider or pharmacist if you have questions. COMMON BRAND NAME(S): Coreg  What should I tell my care team before I take this medication? They need to know if you have any of these conditions: Circulation problems Diabetes History of heart attack or heart disease Liver disease Lung or breathing disease, such as asthma Pheochromocytoma Slow or irregular heartbeat Thyroid disease An unusual or allergic reaction to carvedilol , other medications, foods, dyes, or preservatives Pregnant or trying to get pregnant Breastfeeding How should I use this medication? Take this medication by mouth. Take it as directed on the prescription label at the same time every day. Take it with food. Keep taking it unless your care team tells you to stop. Talk to your care team about the use of this medication in children. Special care may be needed. Overdosage: If you think you have taken too much of this medicine contact a poison control center or emergency room at once. NOTE: This medicine is only for you. Do not share this medicine with others. What if I miss a dose? If you miss a dose, take it as soon as you can. If it is almost time for your next dose, take only that dose. Do not take double or extra doses. What may interact with this medication? This medication may interact with the following: Certain medications for blood pressure, heart disease, irregular heartbeat Certain medications for depression, such as fluoxetine or paroxetine Certain medications for diabetes, such as  glipizide or glyburide Cimetidine Clonidine Cyclosporine Digoxin MAOIs, such as Carbex, Eldepryl, Marplan, Nardil, and Parnate Reserpine Rifampin This list may not describe all possible interactions. Give your health care provider a list of all the medicines, herbs, non-prescription drugs, or dietary supplements you use. Also tell them if you smoke, drink alcohol, or use illegal drugs. Some items may interact with your medicine. What should I watch for while using this medication? Visit your care team for regular checks on your progress. Check your blood pressure as directed. Know what your blood pressure should be and when to contact your care team. This medication may affect your coordination, reaction time, or judgment. Do not drive or operate machinery until you know how this medication affects you. Sit up or stand slowly to reduce the risk of dizzy or fainting spells. Drinking alcohol with this medication can increase the risk of these side effects. Do not suddenly stop taking this medication. This may increase your risk of side effects, such as chest pain and heart attack. If you no longer need to take this medication, your care team will lower the dose slowly over time to decrease the risk of side effects. If you are going to need surgery or a procedure, tell your care team that you are using this medication. This medication may affect blood glucose levels. It can also mask the symptoms of low blood sugar, such as a rapid heartbeat and tremors. If you have diabetes, it is important to check your blood sugar often while you are taking this medication. Do not treat yourself for coughs, colds,  or pain while you are using this medication without asking your care team for advice. Some medications may increase your blood pressure. What side effects may I notice from receiving this medication? Side effects that you should report to your care team as soon as possible: Allergic reactions--skin rash,  itching, hives, swelling of the face, lips, tongue, or throat Heart failure--shortness of breath, swelling of the ankles, feet, or hands, sudden weight gain, unusual weakness or fatigue Low blood pressure--dizziness, feeling faint or lightheaded, blurry vision Raynaud's--cool, numb, or painful fingers or toes that may change color from pale, to blue, to red Slow heartbeat--dizziness, feeling faint or lightheaded, confusion, trouble breathing, unusual weakness or fatigue Worsening mood, feelings of depression Side effects that usually do not require medical attention (report to your care team if they continue or are bothersome): Change in sex drive or performance Diarrhea Dizziness Fatigue Headache This list may not describe all possible side effects. Call your doctor for medical advice about side effects. You may report side effects to FDA at 1-800-FDA-1088. Where should I keep my medication? Keep out of the reach of children and pets. Store at room temperature between 20 and 25 degrees C (68 and 77 degrees F). Protect from moisture. Keep the container tightly closed. Throw away any unused medication after the expiration date. NOTE: This sheet is a summary. It may not cover all possible information. If you have questions about this medicine, talk to your doctor, pharmacist, or health care provider.  2024 Elsevier/Gold Standard (2022-09-08 00:00:00)

## 2024-04-04 MED ORDER — CARVEDILOL 12.5 MG PO TABS
12.5000 mg | ORAL_TABLET | Freq: Two times a day (BID) | ORAL | 0 refills | Status: DC
Start: 2024-04-04 — End: 2024-04-26

## 2024-04-06 ENCOUNTER — Ambulatory Visit: Attending: Primary Care

## 2024-04-06 DIAGNOSIS — Z13220 Encounter for screening for lipoid disorders: Secondary | ICD-10-CM

## 2024-04-06 DIAGNOSIS — Z131 Encounter for screening for diabetes mellitus: Secondary | ICD-10-CM

## 2024-04-06 DIAGNOSIS — I10 Essential (primary) hypertension: Secondary | ICD-10-CM

## 2024-04-06 DIAGNOSIS — N921 Excessive and frequent menstruation with irregular cycle: Secondary | ICD-10-CM

## 2024-04-07 ENCOUNTER — Ambulatory Visit: Payer: Self-pay | Admitting: Primary Care

## 2024-04-07 LAB — CBC WITH DIFFERENTIAL/PLATELET
Basophils Absolute: 0 x10E3/uL (ref 0.0–0.2)
Basos: 0 %
EOS (ABSOLUTE): 0.2 x10E3/uL (ref 0.0–0.4)
Eos: 2 %
Hematocrit: 36.7 % (ref 34.0–46.6)
Hemoglobin: 11.8 g/dL (ref 11.1–15.9)
Immature Grans (Abs): 0 x10E3/uL (ref 0.0–0.1)
Immature Granulocytes: 0 %
Lymphocytes Absolute: 3.2 x10E3/uL — ABNORMAL HIGH (ref 0.7–3.1)
Lymphs: 36 %
MCH: 27.6 pg (ref 26.6–33.0)
MCHC: 32.2 g/dL (ref 31.5–35.7)
MCV: 86 fL (ref 79–97)
Monocytes Absolute: 0.5 x10E3/uL (ref 0.1–0.9)
Monocytes: 6 %
Neutrophils Absolute: 5 x10E3/uL (ref 1.4–7.0)
Neutrophils: 56 %
Platelets: 304 x10E3/uL (ref 150–450)
RBC: 4.28 x10E6/uL (ref 3.77–5.28)
RDW: 14.7 % (ref 11.7–15.4)
WBC: 8.9 x10E3/uL (ref 3.4–10.8)

## 2024-04-07 LAB — CMP14+EGFR
ALT: 31 IU/L (ref 0–32)
AST: 23 IU/L (ref 0–40)
Albumin: 4.5 g/dL (ref 3.9–4.9)
Alkaline Phosphatase: 114 IU/L (ref 44–121)
BUN/Creatinine Ratio: 13 (ref 9–23)
BUN: 11 mg/dL (ref 6–20)
Bilirubin Total: 0.6 mg/dL (ref 0.0–1.2)
CO2: 21 mmol/L (ref 20–29)
Calcium: 9.6 mg/dL (ref 8.7–10.2)
Chloride: 98 mmol/L (ref 96–106)
Creatinine, Ser: 0.88 mg/dL (ref 0.57–1.00)
Globulin, Total: 2.2 g/dL (ref 1.5–4.5)
Glucose: 119 mg/dL — ABNORMAL HIGH (ref 70–99)
Potassium: 4 mmol/L (ref 3.5–5.2)
Sodium: 136 mmol/L (ref 134–144)
Total Protein: 6.7 g/dL (ref 6.0–8.5)
eGFR: 87 mL/min/1.73 (ref >59)

## 2024-04-07 LAB — LIPID PANEL
Chol/HDL Ratio: 3.3 ratio (ref 0.0–4.4)
Cholesterol, Total: 124 mg/dL (ref 100–199)
HDL: 38 mg/dL — ABNORMAL LOW (ref >39)
LDL Chol Calc (NIH): 44 mg/dL (ref 0–99)
Triglycerides: 269 mg/dL — ABNORMAL HIGH (ref 0–149)
VLDL Cholesterol Cal: 42 mg/dL — ABNORMAL HIGH (ref 5–40)

## 2024-04-07 LAB — HEMOGLOBIN A1C
Est. average glucose Bld gHb Est-mCnc: 143 mg/dL
Hgb A1c MFr Bld: 6.6 % — ABNORMAL HIGH (ref 4.8–5.6)

## 2024-04-18 ENCOUNTER — Telehealth (INDEPENDENT_AMBULATORY_CARE_PROVIDER_SITE_OTHER): Payer: Self-pay | Admitting: Primary Care

## 2024-04-18 NOTE — Telephone Encounter (Signed)
 Called pt to confirm appt. Pt will be present.

## 2024-04-19 ENCOUNTER — Other Ambulatory Visit (INDEPENDENT_AMBULATORY_CARE_PROVIDER_SITE_OTHER): Payer: Self-pay | Admitting: Primary Care

## 2024-04-19 ENCOUNTER — Ambulatory Visit (INDEPENDENT_AMBULATORY_CARE_PROVIDER_SITE_OTHER): Payer: Self-pay

## 2024-04-19 VITALS — BP 130/80

## 2024-04-19 DIAGNOSIS — Z0130 Encounter for examination of blood pressure without abnormal findings: Secondary | ICD-10-CM

## 2024-04-19 DIAGNOSIS — I10 Essential (primary) hypertension: Secondary | ICD-10-CM

## 2024-04-19 NOTE — Progress Notes (Signed)
   Blood Pressure Recheck Visit  Name: Melanie Hunt MRN: [id number] Date of Birth: March 22, 1986  [name] presents today for Blood Pressure recheck with clinical support staff.   BP Readings from Last 3 Encounters:  04/19/24 130/80  03/30/24 137/89  12/22/23 (!) 143/88    Current Outpatient Medications  Medication Sig Dispense Refill   carvedilol  (COREG ) 12.5 MG tablet Take 1 tablet (12.5 mg total) by mouth 2 (two) times daily with a meal. 60 tablet 0   hydrochlorothiazide  (HYDRODIURIL ) 25 MG tablet Take 1 tablet (25 mg total) by mouth daily. 90 tablet 3   rosuvastatin  (CRESTOR ) 40 MG tablet Take 1 tablet (40 mg total) by mouth daily. 90 tablet 3   No current facility-administered medications for this visit.    Hypertensive Medication Review: Patient states that they are taking all their hypertensive medications as prescribed and their last dose of hypertensive medications was this morning   Documentation of any medication adherence discrepancies: none  Provider Recommendation:  Spoke to Melanie Hunt and she stated bp is much better will see at next appt   Patient has been given provider's recommendations and does not have any questions or concerns at this time. Patient will contact the office for any future questions or concerns.

## 2024-04-26 ENCOUNTER — Other Ambulatory Visit (INDEPENDENT_AMBULATORY_CARE_PROVIDER_SITE_OTHER): Payer: Self-pay | Admitting: Primary Care

## 2024-04-26 ENCOUNTER — Other Ambulatory Visit (INDEPENDENT_AMBULATORY_CARE_PROVIDER_SITE_OTHER): Payer: Self-pay

## 2024-04-26 DIAGNOSIS — I10 Essential (primary) hypertension: Secondary | ICD-10-CM

## 2024-04-26 MED ORDER — CARVEDILOL 12.5 MG PO TABS: 12.5000 mg | ORAL_TABLET | Freq: Two times a day (BID) | ORAL | 1 refills | Status: DC

## 2024-05-05 ENCOUNTER — Ambulatory Visit (INDEPENDENT_AMBULATORY_CARE_PROVIDER_SITE_OTHER): Admitting: Primary Care

## 2024-05-13 ENCOUNTER — Ambulatory Visit (INDEPENDENT_AMBULATORY_CARE_PROVIDER_SITE_OTHER): Admitting: Primary Care

## 2024-05-13 ENCOUNTER — Encounter (INDEPENDENT_AMBULATORY_CARE_PROVIDER_SITE_OTHER): Payer: Self-pay | Admitting: Primary Care

## 2024-05-13 VITALS — BP 128/78 | HR 80 | Temp 98.0°F | Resp 16 | Ht 67.0 in | Wt 273.0 lb

## 2024-05-13 DIAGNOSIS — R079 Chest pain, unspecified: Secondary | ICD-10-CM | POA: Diagnosis not present

## 2024-05-13 NOTE — Progress Notes (Signed)
 [facility]  Melanie Hunt, is a 38 y.o. female  RDW:[id number]  FMW:[id number]  DOB - Apr 16, 1986  Chief Complaint  Patient presents with   Sleep Apnea   Chest Pain    Cx pain x 1  7/10  Takes ibuprofen  to help Denies SOB, pain with lifting, or after eating Pain in back, shoulder blades       Subjective:   Melanie Hunt is a 38 y.o. female here today for an acute visit.  Chest Pain  This is a recurrent problem. The current episode started 1 to 4 weeks ago. The onset quality is undetermined. The problem occurs intermittently. The problem has been gradually improving. The pain is present in the epigastric region. The pain is at a severity of 5/10. The pain is moderate. The quality of the pain is described as dull and burning. The pain does not radiate. Associated symptoms include palpitations. The pain is aggravated by lifting, movement and exertion. She has tried NSAIDs for the symptoms. The treatment provided moderate relief. Risk factors include stress, lack of exercise and obesity.  Her family medical history is significant for hypertension.  Patient is unable to tolerate mask for CPAP provided her information for dental appliances for CPAP.  Call Dr. Micky office patient spoke with Diplomatic Services operational officer.  She will need to sign a release form to Dr. Carmin office for review of sleep apnea test.  If she is a good candidate their office will process prior Auth and schedule appointment from there.  No problems updated.  Comprehensive ROS Pertinent positive and negative noted in HPI   No Known Allergies  Past Medical History:  Diagnosis Date   Acute hypoxemic respiratory failure due to COVID-19 (HCC) 11/04/2019   HTN (hypertension)    Idiopathic angioedema     Current Outpatient Medications on File Prior to Visit  Medication Sig Dispense Refill   carvedilol  (COREG ) 12.5 MG tablet Take 1 tablet (12.5 mg total) by mouth 2 (two) times daily with a meal. 180 tablet 1    hydrochlorothiazide  (HYDRODIURIL ) 25 MG tablet Take 1 tablet (25 mg total) by mouth daily. 90 tablet 3   rosuvastatin  (CRESTOR ) 40 MG tablet Take 1 tablet (40 mg total) by mouth daily. 90 tablet 3   No current facility-administered medications on file prior to visit.   Health Maintenance  Topic Date Due   Hepatitis B Vaccine (1 of 3 - 19+ 3-dose series) Never done   HPV Vaccine (1 - 3-dose SCDM series) Never done   Pap with HPV screening  01/09/2018   COVID-19 Vaccine (3 - 2024-25 season) 05/24/2023   Flu Shot  04/22/2024   DTaP/Tdap/Td vaccine (1 - Tdap) 10/20/2024*   Pneumococcal Vaccine (1 of 2 - PCV) 10/20/2024*   Hepatitis C Screening  Completed   HIV Screening  Completed   Meningitis B Vaccine  Aged Out  *Topic was postponed. The date shown is not the original due date.    Objective:   Vitals:   05/13/24 0955  BP: 128/78  Pulse: 80  Resp: 16  Temp: 98 F (36.7 C)  TempSrc: Oral  SpO2: 98%  Weight: 273 lb (123.8 kg)  Height: 5' 7 (1.702 m)   BP Readings from Last 3 Encounters:  05/13/24 128/78  04/19/24 130/80  03/30/24 137/89      Physical Exam Vitals reviewed.  Constitutional:      Appearance: Normal appearance. She is obese.  HENT:     Head: Normocephalic.     Right  Ear: Tympanic membrane, ear canal and external ear normal.     Left Ear: Tympanic membrane, ear canal and external ear normal.     Nose: Nose normal.     Mouth/Throat:     Mouth: Mucous membranes are moist.  Eyes:     Extraocular Movements: Extraocular movements intact.     Pupils: Pupils are equal, round, and reactive to light.  Cardiovascular:     Rate and Rhythm: Normal rate.  Pulmonary:     Effort: Pulmonary effort is normal.     Breath sounds: Normal breath sounds.  Abdominal:     General: Bowel sounds are normal.     Palpations: Abdomen is soft.  Musculoskeletal:        General: Normal range of motion.     Cervical back: Normal range of motion.  Skin:    General: Skin is  warm and dry.  Neurological:     Mental Status: She is alert and oriented to person, place, and time.  Psychiatric:        Mood and Affect: Mood normal.        Behavior: Behavior normal.        Thought Content: Thought content normal.      Assessment & Plan  Melanie Hunt was seen today for sleep apnea and chest pain.  Diagnoses and all orders for this visit:  Chest pain, unspecified type -     EKG 12-Lead     Patient have been counseled extensively about nutrition and exercise. Other issues discussed during this visit include: low cholesterol diet, weight control and daily exercise, foot care, annual eye examinations at Ophthalmology, importance of adherence with medications and regular follow-up. We also discussed long term complications of uncontrolled diabetes and hypertension.   No follow-ups on file.  The patient was given clear instructions to go to ER or return to medical center if symptoms don't improve, worsen or new problems develop. The patient verbalized understanding. The patient was told to call to get lab results if they haven't heard anything in the next week.   This note has been created with Education officer, environmental. Any transcriptional errors are unintentional.   Melanie SHAUNNA Bohr, NP 05/15/2024, 11:58 PM

## 2024-05-27 ENCOUNTER — Ambulatory Visit (INDEPENDENT_AMBULATORY_CARE_PROVIDER_SITE_OTHER)

## 2024-05-27 ENCOUNTER — Ambulatory Visit (HOSPITAL_COMMUNITY)
Admission: EM | Admit: 2024-05-27 | Discharge: 2024-05-27 | Disposition: A | Discharge status: Home / Self Care | Attending: Emergency Medicine | Admitting: Emergency Medicine

## 2024-05-27 ENCOUNTER — Ambulatory Visit (HOSPITAL_COMMUNITY): Payer: Self-pay | Admitting: Emergency Medicine

## 2024-05-27 ENCOUNTER — Encounter (HOSPITAL_COMMUNITY): Payer: Self-pay

## 2024-05-27 DIAGNOSIS — R079 Chest pain, unspecified: Secondary | ICD-10-CM | POA: Insufficient documentation

## 2024-05-27 LAB — CBC WITH DIFFERENTIAL/PLATELET
Abs Immature Granulocytes: 0.04 K/uL (ref 0.00–0.07)
Basophils Absolute: 0 K/uL (ref 0.0–0.1)
Basophils Relative: 0 %
Eosinophils Absolute: 0.2 K/uL (ref 0.0–0.5)
Eosinophils Relative: 2 %
HCT: 36.4 % (ref 36.0–46.0)
Hemoglobin: 12.1 g/dL (ref 12.0–15.0)
Immature Granulocytes: 0 %
Lymphocytes Relative: 34 %
Lymphs Abs: 3.2 K/uL (ref 0.7–4.0)
MCH: 27.8 pg (ref 26.0–34.0)
MCHC: 33.2 g/dL (ref 30.0–36.0)
MCV: 83.7 fL (ref 80.0–100.0)
Monocytes Absolute: 0.6 K/uL (ref 0.1–1.0)
Monocytes Relative: 6 %
Neutro Abs: 5.3 K/uL (ref 1.7–7.7)
Neutrophils Relative %: 58 %
Platelets: 280 K/uL (ref 150–400)
RBC: 4.35 MIL/uL (ref 3.87–5.11)
RDW: 14.3 % (ref 11.5–15.5)
WBC: 9.3 K/uL (ref 4.0–10.5)
nRBC: 0 % (ref 0.0–0.2)

## 2024-05-27 LAB — COMPREHENSIVE METABOLIC PANEL WITH GFR
ALT: 30 U/L (ref 0–44)
AST: 22 U/L (ref 15–41)
Albumin: 4 g/dL (ref 3.5–5.0)
Alkaline Phosphatase: 88 U/L (ref 38–126)
Anion gap: 14 (ref 5–15)
BUN: 11 mg/dL (ref 6–20)
CO2: 24 mmol/L (ref 22–32)
Calcium: 9.4 mg/dL (ref 8.9–10.3)
Chloride: 102 mmol/L (ref 98–111)
Creatinine, Ser: 0.97 mg/dL (ref 0.44–1.00)
GFR, Estimated: >60 mL/min (ref >60)
Glucose, Bld: 77 mg/dL (ref 70–99)
Potassium: 3.6 mmol/L (ref 3.5–5.1)
Sodium: 140 mmol/L (ref 135–145)
Total Bilirubin: 1 mg/dL (ref 0.0–1.2)
Total Protein: 7.2 g/dL (ref 6.5–8.1)

## 2024-05-27 LAB — BRAIN NATRIURETIC PEPTIDE: B Natriuretic Peptide: 19.4 pg/mL (ref 0.0–100.0)

## 2024-05-27 MED ORDER — METHOCARBAMOL 500 MG PO TABS: 500.0000 mg | ORAL_TABLET | Freq: Two times a day (BID) | ORAL | 0 refills | Status: DC

## 2024-05-27 MED ORDER — ACETAMINOPHEN 325 MG PO TABS
ORAL_TABLET | ORAL | Status: AC
  Filled 2024-05-27: qty 2

## 2024-05-27 MED ORDER — ACETAMINOPHEN 325 MG PO TABS
650.0000 mg | ORAL_TABLET | Freq: Once | ORAL | Status: AC
  Administered 2024-05-27: 650 mg via ORAL

## 2024-05-27 NOTE — ED Provider Notes (Signed)
 [facility]    CSN: [id number] Arrival date & time: 05/27/24  1326      History   Chief Complaint No chief complaint on file.   HPI Melanie Hunt is a 38 y.o. female.   Patient presents to clinic over concern of central chest pain that has been ongoing and intermittent for the past few weeks.  Initially noticed pain in between her shoulder blades and now it is much worse in her chest.  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  Was seen at her PCP on 8/22 where she had a EKG.  Has been taking ibuprofen  for the pain without much improvement.  Has not had any shortness of breath.  Has noticed that the swelling in her ankles that is there daily does not go away and is still present in the morning, this is abnormal.  Denies personal or family history of heart failure.  Hypertensive today in clinic, took one of her blood pressure medicines this morning and is scheduled to take 2 more tonight.  He has not missed any doses.  The history is provided by the patient and medical records.    Past Medical History:  Diagnosis Date   Acute hypoxemic respiratory failure due to COVID-19 [facility]) 11/04/2019   HTN (hypertension)    Idiopathic angioedema     Patient Active Problem List   Diagnosis Date Noted   IUD (intrauterine device) in place 08/20/2020   Obesity (BMI 35.0-39.9 without comorbidity) 11/09/2019   Essential hypertension 06/09/2018   Metrorrhagia 12/06/2012    Past Surgical History:  Procedure Laterality Date   UTERINE ABLATION      OB History   No obstetric history on file.      Home Medications    Prior to Admission medications   Medication Sig Start Date End Date Taking? Authorizing Provider  carvedilol  (COREG ) 12.5 MG tablet Take 1 tablet (12.5 mg total) by mouth 2 (two) times daily with a meal. 04/26/24  Yes Celestia Rosaline SQUIBB, NP  hydrochlorothiazide  (HYDRODIURIL ) 25 MG  tablet Take 1 tablet (25 mg total) by mouth daily. 10/21/23  Yes Celestia Rosaline SQUIBB, NP  rosuvastatin  (CRESTOR ) 40 MG tablet Take 1 tablet (40 mg total) by mouth daily. 10/26/23  Yes Celestia Rosaline SQUIBB, NP  methocarbamol  (ROBAXIN ) 500 MG tablet Take 1 tablet (500 mg total) by mouth 2 (two) times daily. 05/27/24   Dreama Lugenia SAILOR, FNP    Family History Family History  Problem Relation Age of Onset   Hypertension Mother    Hypertension Maternal Uncle    Hypertension Maternal Grandmother    Esophageal cancer Neg Hx    Colon cancer Neg Hx    Rectal cancer Neg Hx    Stomach cancer Neg Hx     Social History Social History   Tobacco Use   Smoking status: Never   Smokeless tobacco: Never  Vaping Use   Vaping status: Never Used  Substance Use Topics   Alcohol use: Yes    Comment: 3 to 4 times per week.   Drug use: Never     Allergies   Patient has no known allergies.   Review of Systems Review of Systems  Per HPI  Physical Exam Triage Vital Signs ED Triage Vitals  Encounter Vitals Group     BP 05/27/24 1446 (!) 169/106     Girls Systolic BP Percentile --  Girls Diastolic BP Percentile --      Boys Systolic BP Percentile --      Boys Diastolic BP Percentile --      Pulse Rate 05/27/24 1446 83     Resp 05/27/24 1446 18     Temp 05/27/24 1446 98.1 F (36.7 C)     Temp Source 05/27/24 1446 Oral     SpO2 05/27/24 1446 95 %     Weight --      Height --      Head Circumference --      Peak Flow --      Pain Score 05/27/24 1450 6     Pain Loc --      Pain Education --      Exclude from Growth Chart --    No data found.  Updated Vital Signs BP (!) 169/106 (BP Location: Left Arm)   Pulse 83   Temp 98.1 F (36.7 C) (Oral)   Resp 18   SpO2 95%   Visual Acuity Right Eye Distance:   Left Eye Distance:   Bilateral Distance:    Right Eye Near:   Left Eye Near:    Bilateral Near:     Physical Exam Vitals and nursing note reviewed.  Constitutional:       Appearance: Normal appearance.  HENT:     Head: Normocephalic and atraumatic.     Right Ear: External ear normal.     Left Ear: External ear normal.     Nose: Nose normal.     Mouth/Throat:     Mouth: Mucous membranes are moist.  Eyes:     Conjunctiva/sclera: Conjunctivae normal.  Cardiovascular:     Rate and Rhythm: Normal rate and regular rhythm.     Heart sounds: Normal heart sounds. No murmur heard. Pulmonary:     Effort: Pulmonary effort is normal. No respiratory distress.     Breath sounds: Normal breath sounds.  Chest:       Comments: Anterior chest pain with arm range of motion.  Posterior upper back pain in same area with range of motion.  Strength 5 out of 5 in upper extremities.  No obvious deformity, crepitus or bruising on palpitation of the chest wall. Musculoskeletal:        General: No tenderness.  Skin:    General: Skin is warm and dry.  Neurological:     General: No focal deficit present.     Mental Status: She is alert and oriented to person, place, and time.  Psychiatric:        Mood and Affect: Mood normal.        Behavior: Behavior normal.      UC Treatments / Results  Labs (all labs ordered are listed, but only abnormal results are displayed) Labs Reviewed  COMPREHENSIVE METABOLIC PANEL WITH GFR  CBC WITH DIFFERENTIAL/PLATELET  BRAIN NATRIURETIC PEPTIDE    EKG   Radiology DG Chest 2 View Result Date: 05/27/2024 CLINICAL DATA:  Two day history of chest pain EXAM: CHEST - 2 VIEW COMPARISON:  Chest radiograph dated 11/07/2019 FINDINGS: Normal lung volumes. No focal consolidations. No pleural effusion or pneumothorax. Similar mildly enlarged cardiomediastinal silhouette. No acute osseous abnormality. IMPRESSION: No active cardiopulmonary disease. Electronically Signed   By: Limin  Xu M.D.   On: 05/27/2024 16:31    Procedures Procedures (including critical care time)  Medications Ordered in UC Medications  acetaminophen  (TYLENOL ) tablet 650  mg (650 mg Oral Given 05/27/24 1559)  Initial Impression / Assessment and Plan / UC Course  I have reviewed the triage vital signs and the nursing notes.  Pertinent labs & imaging results that were available during my care of the patient were reviewed by me and considered in my medical decision making (see chart for details).  Vitals and triage reviewed, patient is hemodynamically stable.  Lungs vesicular, heart with regular rate and rhythm.  Pain is reproducible with movement, reassuring for soft tissue etiology.  EKG shows normal sinus rhythm, without ST elevation or ST depression.   Patient has been having some swelling and is hypertensive.  Will check basic labs and BNP today.  Trial muscle relaxer.  Strict emergency precautions given if symptoms evolve or worsen.     Final Clinical Impressions(s) / UC Diagnoses   Final diagnoses:  Chest pain, unspecified type     Discharge Instructions      Your chest x-ray did not show abnormalities.  We have done some blood work and you will be contacted if urgent follow-up is needed.  Take the muscle relaxer up to 2 times daily as needed, do not drink alcohol or drive on this medication as it may cause drowsiness or sedation.  Follow-up with your primary care provider if your pain continues into next week.  Seek immediate care at the nearest emergency department if you develop any new or worsening symptoms.      ED Prescriptions     Medication Sig Dispense Auth. Provider   methocarbamol  (ROBAXIN ) 500 MG tablet  (Status: Discontinued) Take 1 tablet (500 mg total) by mouth 2 (two) times daily. 20 tablet Dreama, Calisha Tindel  N, FNP   methocarbamol  (ROBAXIN ) 500 MG tablet  (Status: Discontinued) Take 1 tablet (500 mg total) by mouth 2 (two) times daily. 20 tablet Dreama, Jaiven Graveline  N, FNP   methocarbamol  (ROBAXIN ) 500 MG tablet  (Status: Discontinued) Take 1 tablet (500 mg total) by mouth 2 (two) times daily. 20 tablet Dreama, Weslie Pretlow  N, FNP    methocarbamol  (ROBAXIN ) 500 MG tablet Take 1 tablet (500 mg total) by mouth 2 (two) times daily. 20 tablet Dreama, Ebrahim Deremer  N, FNP      PDMP not reviewed this encounter.   Dreama, Estee Yohe  N, FNP 05/27/24 1702

## 2024-05-27 NOTE — ED Triage Notes (Signed)
 Patient presents to the office for R- and L shoulder blade pain. Patient reports chest pain started 2 days ago. Patient denies any trauma to her shoulder and chest.

## 2024-05-27 NOTE — Discharge Instructions (Addendum)
 Your chest x-ray did not show abnormalities.  We have done some blood work and you will be contacted if urgent follow-up is needed.  Take the muscle relaxer up to 2 times daily as needed, do not drink alcohol or drive on this medication as it may cause drowsiness or sedation.  Follow-up with your primary care provider if your pain continues into next week.  Seek immediate care at the nearest emergency department if you develop any new or worsening symptoms.

## 2024-06-16 ENCOUNTER — Ambulatory Visit: Admitting: Sleep Medicine

## 2024-06-16 ENCOUNTER — Ambulatory Visit: Admitting: Adult Health

## 2024-06-16 DIAGNOSIS — G4733 Obstructive sleep apnea (adult) (pediatric): Secondary | ICD-10-CM

## 2024-06-22 ENCOUNTER — Other Ambulatory Visit: Payer: Self-pay | Admitting: Sleep Medicine

## 2024-06-22 ENCOUNTER — Encounter: Payer: Self-pay | Admitting: Sleep Medicine

## 2024-06-22 ENCOUNTER — Ambulatory Visit (INDEPENDENT_AMBULATORY_CARE_PROVIDER_SITE_OTHER): Admitting: Sleep Medicine

## 2024-06-22 VITALS — BP 130/80 | HR 86 | Temp 98.2°F | Ht 67.0 in | Wt 273.8 lb

## 2024-06-22 DIAGNOSIS — Z6841 Body Mass Index (BMI) 40.0 and over, adult: Secondary | ICD-10-CM | POA: Diagnosis not present

## 2024-06-22 DIAGNOSIS — I10 Essential (primary) hypertension: Secondary | ICD-10-CM

## 2024-06-22 DIAGNOSIS — G4733 Obstructive sleep apnea (adult) (pediatric): Secondary | ICD-10-CM

## 2024-06-22 MED ORDER — ZEPBOUND 2.5 MG/0.5ML ~~LOC~~ SOAJ
2.5000 mg | SUBCUTANEOUS | 1 refills | Status: AC
Start: 2024-06-22 — End: ?

## 2024-06-22 NOTE — Patient Instructions (Addendum)

## 2024-06-22 NOTE — Progress Notes (Signed)
 Name:Melanie Hunt MRN: [id number] DOB: [birth year]   CHIEF COMPLAINT:  CPAP F/U   HISTORY OF PRESENT ILLNESS: [name] is a 38 y.o. w/ a h/o OSA, HTN, hyperlipidemia and morbid obesity who presents for CPAP follow up visit. Reports using CPAP therapy every night, which is confirmed by compliance data. Reports feeling more refreshed upon awakening with CPAP therapy.    PAST MEDICAL HISTORY :   has a past medical history of Acute hypoxemic respiratory failure due to COVID-19 [facility]) (11/04/2019), HTN (hypertension), and Idiopathic angioedema.  has a past surgical history that includes UTERINE ABLATION. Prior to Admission medications   Medication Sig Start Date End Date Taking? Authorizing Provider  carvedilol  (COREG ) 12.5 MG tablet Take 1 tablet (12.5 mg total) by mouth 2 (two) times daily with a meal. 04/26/24  Yes Celestia Rosaline SQUIBB, NP  hydrochlorothiazide  (HYDRODIURIL ) 25 MG tablet Take 1 tablet (25 mg total) by mouth daily. 10/21/23  Yes Celestia Rosaline SQUIBB, NP  methocarbamol  (ROBAXIN ) 500 MG tablet Take 1 tablet (500 mg total) by mouth 2 (two) times daily. 05/27/24  Yes Garrison, [place]  N, FNP  naproxen (NAPROSYN) 500 MG tablet Take 500 mg by mouth 2 (two) times daily with a meal. 05/06/18  Yes [provider]  rosuvastatin  (CRESTOR ) 40 MG tablet Take 1 tablet (40 mg total) by mouth daily. 10/26/23  Yes Celestia Rosaline SQUIBB, NP   No Known Allergies  FAMILY HISTORY:  family history includes Hypertension in her maternal grandmother, maternal uncle, and mother. SOCIAL HISTORY:  reports that she has never smoked. She has never used smokeless tobacco. She reports current alcohol use. She reports that she does not use drugs.   Review of Systems:  Gen:  Denies  fever, sweats, chills weight loss  HEENT: Denies blurred vision, double vision, ear pain, eye pain, hearing loss, nose bleeds, sore throat Cardiac:  No dizziness, chest pain or heaviness, chest tightness,edema, No  JVD Resp:   No cough, -sputum production, -shortness of breath,-wheezing, -hemoptysis,  Gi: Denies swallowing difficulty, stomach pain, nausea or vomiting, diarrhea, constipation, bowel incontinence Gu:  Denies bladder incontinence, burning urine Ext:   Denies Joint pain, stiffness or swelling Skin: Denies  skin rash, easy bruising or bleeding or hives Endoc:  Denies polyuria, polydipsia , polyphagia or weight change Psych:   Denies depression, insomnia or hallucinations  Other:  All other systems negative  VITAL SIGNS: BP 130/80   Pulse 86   Temp 98.2 F (36.8 C)   Ht 5' 7 (1.702 m)   Wt 273 lb 12.8 oz (124.2 kg)   LMP  (LMP Unknown)   SpO2 96%   BMI 42.88 kg/m    Physical Examination:   General Appearance: No distress  EYES PERRLA, EOM intact.   NECK Supple, No JVD Pulmonary: normal breath sounds, No wheezing.  CardiovascularNormal S1,S2.  No m/r/g.   Abdomen: Benign, Soft, non-tender. Skin:   warm, no rashes, no ecchymosis  Extremities: normal, no cyanosis, clubbing. Neuro:without focal findings,  speech normal  PSYCHIATRIC: Mood, affect within normal limits.   ASSESSMENT AND PLAN  OSA Patient is using and benefiting from CPAP therapy. Discussed the consequences of untreated sleep apnea. Advised not to drive drowsy for safety of patient and others. Will follow up in 3 months.     HTN Stable, on current management. Following with PCP.   Morbid obesity Counseled patient on diet and lifestyle modification. Will also try patient on Zepbound and will titrate as tolerated.  Denies any personal or family history of thyroid medullary CA or pancreatitis.    Patient  satisfied with Plan of action and management. All questions answered  I spent a total of 14 minutes reviewing chart data, face-to-face evaluation with the patient, counseling and coordination of care as detailed above.    Suede Greenawalt, M.D.  [facility]

## 2024-06-24 ENCOUNTER — Telehealth: Payer: Self-pay

## 2024-06-24 ENCOUNTER — Other Ambulatory Visit (HOSPITAL_COMMUNITY): Payer: Self-pay

## 2024-06-24 NOTE — Telephone Encounter (Signed)
*  Pulm  Pharmacy Patient Advocate Encounter   Received notification from RX Request Messages that prior authorization for Zepbound 2.5mg  is required/requested.   -pending insurance information, message sent to office

## 2024-06-28 NOTE — Telephone Encounter (Signed)
 Patient has not read MyChart message from the office for updated insurance information at this time.

## 2024-07-01 NOTE — Telephone Encounter (Signed)
 Patient still not read mychart message/ sent updated insurance. Closing request.

## 2024-07-12 NOTE — Telephone Encounter (Signed)
 Called pt and LVM to please update her insurance information in order for Zepbound PA to be processed.

## 2024-07-18 ENCOUNTER — Other Ambulatory Visit (HOSPITAL_COMMUNITY): Payer: Self-pay

## 2024-08-30 ENCOUNTER — Other Ambulatory Visit: Payer: Self-pay | Admitting: Sleep Medicine

## 2024-08-31 NOTE — Telephone Encounter (Signed)
 Spoke with pt to notify that we are working on a PA and will notify her once we receive a decision. NFN.

## 2024-08-31 NOTE — Telephone Encounter (Signed)
 Copied from CRM #[id number]. Topic: Clinical - Prescription Issue >> Aug 31, 2024  1:41 PM Melanie Hunt wrote: Reason for CRM: Patient has updated insurance and group number on file - would like to confirm when she will receive the refill for tirzepatide  (ZEPBOUND ) 2.5 MG/0.5ML Pen [[id number]].  Callback number: [phone]

## 2024-09-01 NOTE — Telephone Encounter (Signed)
 Reached out to pt to see if she could upload a physical picture of her insurance card or come by our clinic to get it scanned in. And also spoke with pt to clarify if she has a separate pharmacy benefits card or if its lumped together with her insurance card, and she said it was lumped together. Will follow up once we receive updated insurance info.

## 2024-09-01 NOTE — Telephone Encounter (Signed)
 Received photos of insurance cards via [place] message and this info has been sent to pharmacy team to review.

## 2024-09-02 ENCOUNTER — Encounter

## 2024-09-02 ENCOUNTER — Other Ambulatory Visit (HOSPITAL_COMMUNITY): Payer: Self-pay

## 2024-09-02 NOTE — Telephone Encounter (Signed)
 Per telephone/mychart encounters and pharmacy team -- medication is a plan exclusion drug with her insurance and cannot be filled. Pt is aware and advised to seek recommendations from Insurance/PCP to discuss alternatives.  NFN.

## 2024-09-08 ENCOUNTER — Encounter (INDEPENDENT_AMBULATORY_CARE_PROVIDER_SITE_OTHER): Payer: Self-pay | Admitting: Primary Care

## 2024-09-09 NOTE — Telephone Encounter (Signed)
 Will forward to provider

## 2024-09-20 ENCOUNTER — Other Ambulatory Visit: Payer: Self-pay

## 2024-09-20 ENCOUNTER — Other Ambulatory Visit (HOSPITAL_COMMUNITY)
Admission: RE | Admit: 2024-09-20 | Discharge: 2024-09-20 | Disposition: A | Source: Ambulatory Visit | Attending: Obstetrics and Gynecology | Admitting: Obstetrics and Gynecology

## 2024-09-20 ENCOUNTER — Ambulatory Visit (INDEPENDENT_AMBULATORY_CARE_PROVIDER_SITE_OTHER): Admitting: Obstetrics and Gynecology

## 2024-09-20 ENCOUNTER — Encounter: Payer: Self-pay | Admitting: Obstetrics and Gynecology

## 2024-09-20 VITALS — BP 128/72 | HR 74 | Wt 268.9 lb

## 2024-09-20 DIAGNOSIS — Z124 Encounter for screening for malignant neoplasm of cervix: Secondary | ICD-10-CM

## 2024-09-20 DIAGNOSIS — Z113 Encounter for screening for infections with a predominantly sexual mode of transmission: Secondary | ICD-10-CM | POA: Insufficient documentation

## 2024-09-20 DIAGNOSIS — Z01419 Encounter for gynecological examination (general) (routine) without abnormal findings: Secondary | ICD-10-CM | POA: Diagnosis not present

## 2024-09-20 DIAGNOSIS — Z538 Procedure and treatment not carried out for other reasons: Secondary | ICD-10-CM

## 2024-09-20 DIAGNOSIS — Z30432 Encounter for removal of intrauterine contraceptive device: Secondary | ICD-10-CM | POA: Diagnosis not present

## 2024-09-20 DIAGNOSIS — N921 Excessive and frequent menstruation with irregular cycle: Secondary | ICD-10-CM

## 2024-09-20 DIAGNOSIS — Z975 Presence of (intrauterine) contraceptive device: Secondary | ICD-10-CM

## 2024-09-20 MED ORDER — IBUPROFEN 800 MG PO TABS
800.0000 mg | ORAL_TABLET | Freq: Once | ORAL | Status: AC
  Administered 2024-09-20: 800 mg via ORAL

## 2024-09-20 MED ORDER — MISOPROSTOL 100 MCG PO TABS: ORAL_TABLET | ORAL | 0 refills | Status: DC

## 2024-09-20 MED ORDER — IBUPROFEN 800 MG PO TABS: 800.0000 mg | ORAL_TABLET | Freq: Three times a day (TID) | ORAL | 1 refills | Status: DC | PRN

## 2024-09-20 MED ORDER — DIAZEPAM 5 MG PO TABS: 5.0000 mg | ORAL_TABLET | Freq: Once | ORAL | 0 refills | Status: AC

## 2024-09-20 NOTE — Progress Notes (Signed)
" ° ° °  GYNECOLOGY OFFICE PROCEDURE NOTE  Paizley Ramella is a 38 y.o. G0P0000 here for  IUD removal. No GYN concerns.    IUD Removal  Patient identified, informed consent performed, consent signed.  Patient was in the dorsal lithotomy position, normal external genitalia was noted.  A speculum was placed in the patient's vagina, normal discharge was noted, no lesions. The cervix was visualized, no lesions, no abnormal discharge.  The strings of the IUD were grasped and pulled using Kelly forceps. The IUD was removed in its entirety.  Patient tolerated the procedure well with cramping.    Alethea Beauchesne is a 38 y.o. G0P0000 here for Mirena IUD insertion. No GYN concerns.    IUD Insertion Procedure Note Patient identified, informed consent performed, consent signed.   Discussed risks of irregular bleeding, cramping, infection, malpositioning or misplacement of the IUD outside the uterus which may require further procedure such as laparoscopy. Also discussed >99% contraception efficacy, increased risk of ectopic pregnancy with failure of method.  Time out was performed.  Urine pregnancy test negative.  Speculum placed in the vagina.  Cervix visualized.  Cleaned with Betadine x 2.  Grasped anteriorly with a single tooth tenaculum.  Pt was fairly uncomfortable and cervix was difficult to visualize.  Attempted to pass endo biopsy pipelle to sound the uterus, but the cervix appeared to be stenotic.  After several attempts the procedure was discontinued.  Explained tight cervix to patient and offered to try again ASAP with premedication with ibuprofen , valium and misoprostol.   Jerilynn Buddle, MD, FACOG Obstetrician & Gynecologist, [facility] for [facility], [facility]  "

## 2024-09-20 NOTE — Addendum Note (Signed)
 Addended byBETHA BARTHOLOMEW BARMAN on: 09/20/2024 04:42 PM   Modules accepted: Orders

## 2024-09-20 NOTE — Progress Notes (Signed)
 "   GYNECOLOGY ANNUAL PREVENTATIVE CARE ENCOUNTER NOTE  History:     Melanie Hunt is a 38 y.o. G0P0000 female here for a routine annual gynecologic exam.  Current complaints: need for IUD removal and insertion.   Denies abnormal vaginal bleeding, discharge, pelvic pain, problems with intercourse or other gynecologic concerns.  Pt utilizes IUD to decrease or stop menstrual bleeding.  The idea has been in place for almost 6 years.   Gynecologic History No LMP recorded. (Menstrual status: IUD). Contraception: IUD Last Pap: 2016. Last mammogram: n/a  Obstetric History OB History  Gravida Para Term Preterm AB Living  0 0 0 0 0 0  SAB IAB Ectopic Multiple Live Births  0 0 0 0 0    Past Medical History:  Diagnosis Date   Acute hypoxemic respiratory failure due to COVID-19 (HCC) 11/04/2019   HTN (hypertension)    Idiopathic angioedema     Past Surgical History:  Procedure Laterality Date   DILATION AND CURETTAGE OF UTERUS     HYSTEROSCOPY  03/11/2018   UTERINE ABLATION      Medications Ordered Prior to Encounter[1]  Allergies[2]  Social History:  reports that she has never smoked. She has never used smokeless tobacco. She reports current alcohol use. She reports that she does not use drugs.  Family History  Problem Relation Age of Onset   Hypertension Mother    Hypertension Maternal Uncle    Hypertension Maternal Grandmother    Esophageal cancer Neg Hx    Colon cancer Neg Hx    Rectal cancer Neg Hx    Stomach cancer Neg Hx     The following portions of the patient's history were reviewed and updated as appropriate: allergies, current medications, past family history, past medical history, past social history, past surgical history and problem list.  Review of Systems Pertinent items noted in HPI and remainder of comprehensive ROS otherwise negative.  Physical Exam:  BP 128/72   Pulse 74   Wt 268 lb 14.4 oz (122 kg)   BMI 42.12 kg/m  CONSTITUTIONAL:  Well-developed, obese, well-nourished female in no acute distress.  HENT:  Normocephalic, atraumatic, External right and left ear normal. Oropharynx is clear and moist EYES: Conjunctivae and EOM are normal.  NECK: Normal range of motion, supple, no masses.  Normal thyroid.  SKIN: Skin is warm and dry. No rash noted. Not diaphoretic. No erythema. No pallor. MUSCULOSKELETAL: Normal range of motion. No tenderness.  No cyanosis, clubbing, or edema.  2+ distal pulses. NEUROLOGIC: Alert and oriented to person, place, and time. Normal reflexes, muscle tone coordination.  PSYCHIATRIC: Normal mood and affect. Normal behavior. Normal judgment and thought content. CARDIOVASCULAR: Normal heart rate noted, regular rhythm RESPIRATORY: Clear to auscultation bilaterally. Effort and breath sounds normal, no problems with respiration noted. BREASTS: deferred ABDOMEN: Soft, no distention noted.  No tenderness, rebound or guarding.  PELVIC: Normal appearing external genitalia and urethral meatus; normal appearing vaginal mucosa and cervix.  No abnormal discharge noted.  Pap smear obtained.  Vaginal swab obtained.  IUD string seen and removed, see separate note.  Normal uterine size, no other palpable masses, no uterine or adnexal tenderness.  Performed in the presence of a chaperone.   Assessment and Plan:    1. Pap smear for cervical cancer screening (Primary)  - Cytology - PAP( [facility])  2. Metrorrhagia Controlled with IUD, need to replace mirena IUD  3. IUD (intrauterine device) in place   4. Encounter for IUD removal See  separate note  5. Unsuccessful insertion of intrauterine device (IUD) See separate note Will reschedule for IUD placement ASAP, will premedicate with valium, ibuprofen , and misoprostol.  - misoprostol (CYTOTEC) 100 MCG tablet; 1 tab per  vagina the night before and the morning of the procedure  Dispense: 2 tablet; Refill: 0 - diazepam (VALIUM) 5 MG tablet; Take 1 tablet (5 mg  total) by mouth once for 1 dose. Take 20-30 minutes before procedure but after signing consent  Dispense: 1 tablet; Refill: 0 - ibuprofen  (ADVIL ) 800 MG tablet; Take 1 tablet (800 mg total) by mouth 3 (three) times daily with meals as needed for headache, moderate pain (pain score 4-6) or cramping.  Dispense: 30 tablet; Refill: 1  6. Routine screening for STI (sexually transmitted infection) Pt request - Cervicovaginal ancillary only  Will follow up results of pap smear and manage accordingly. Routine preventative health maintenance measures emphasized. Please refer to After Visit Summary for other counseling recommendations.      Jerilynn Buddle, MD, FACOG Obstetrician & Gynecologist, [facility] for [facility], [facility]      [1]  Current Outpatient Medications on File Prior to Visit  Medication Sig Dispense Refill   carvedilol  (COREG ) 12.5 MG tablet Take 1 tablet (12.5 mg total) by mouth 2 (two) times daily with a meal. 180 tablet 1   hydrochlorothiazide  (HYDRODIURIL ) 25 MG tablet Take 1 tablet (25 mg total) by mouth daily. 90 tablet 3   rosuvastatin  (CRESTOR ) 40 MG tablet Take 1 tablet (40 mg total) by mouth daily. 90 tablet 3   tirzepatide  (ZEPBOUND ) 2.5 MG/0.5ML Pen Inject 2.5 mg into the skin once a week. (Patient not taking: Reported on 09/20/2024) 2 mL 1   No current facility-administered medications on file prior to visit.  [2] No Known Allergies  "

## 2024-09-21 LAB — CERVICOVAGINAL ANCILLARY ONLY
Bacterial Vaginitis (gardnerella): NEGATIVE
Candida Glabrata: NEGATIVE
Candida Vaginitis: NEGATIVE
Chlamydia: NEGATIVE
Comment: NEGATIVE
Comment: NEGATIVE
Comment: NEGATIVE
Comment: NEGATIVE
Comment: NEGATIVE
Comment: NORMAL
Neisseria Gonorrhea: NEGATIVE
Trichomonas: NEGATIVE

## 2024-09-23 ENCOUNTER — Ambulatory Visit: Payer: Self-pay | Admitting: Obstetrics and Gynecology

## 2024-09-26 LAB — CYTOLOGY - PAP
Comment: NEGATIVE
Diagnosis: UNDETERMINED — AB
High risk HPV: NEGATIVE

## 2024-10-03 ENCOUNTER — Telehealth (INDEPENDENT_AMBULATORY_CARE_PROVIDER_SITE_OTHER): Payer: Self-pay | Admitting: Primary Care

## 2024-10-03 NOTE — Telephone Encounter (Signed)
 Called pt to confirm appt. Pt did not answer and VM was full. If pt does call back, please advise

## 2024-10-04 ENCOUNTER — Ambulatory Visit (INDEPENDENT_AMBULATORY_CARE_PROVIDER_SITE_OTHER): Admitting: Primary Care

## 2024-10-04 VITALS — BP 148/83 | HR 82 | Resp 16 | Ht 67.0 in | Wt 274.0 lb

## 2024-10-04 DIAGNOSIS — Z7984 Long term (current) use of oral hypoglycemic drugs: Secondary | ICD-10-CM | POA: Diagnosis not present

## 2024-10-04 DIAGNOSIS — E669 Obesity, unspecified: Secondary | ICD-10-CM | POA: Diagnosis not present

## 2024-10-04 DIAGNOSIS — Z7689 Persons encountering health services in other specified circumstances: Secondary | ICD-10-CM

## 2024-10-04 DIAGNOSIS — E119 Type 2 diabetes mellitus without complications: Secondary | ICD-10-CM | POA: Diagnosis not present

## 2024-10-04 MED ORDER — METFORMIN HCL ER 500 MG PO TB24: 500.0000 mg | ORAL_TABLET | Freq: Every day | ORAL | 1 refills | Status: AC

## 2024-10-04 NOTE — Progress Notes (Unsigned)
 " [facility]  Melanie Hunt, is a 39 y.o. female  RDW:[id number]  FMW:[id number]  DOB - [birth year]  No chief complaint on file.      Subjective:   Melanie Hunt is a 39 y.o. female here today for an acute visit.  Patient in today with problem with insurance she has sleep apnea and was prescribed Zepbound  but deny states she had to be on metformin  first for weight loss.  A1c 6.6.  She is a diabetic will prescribe metformin  500 mg XL daily.  Patient to return in 4 weeks okay everything in light has a record so have to pick patient up  HPI  No problems updated.  Comprehensive ROS Pertinent positive and negative noted in HPI   Allergies[1]  Past Medical History:  Diagnosis Date   Acute hypoxemic respiratory failure due to COVID-19 (HCC) 11/04/2019   HTN (hypertension)    Idiopathic angioedema     Medications Ordered Prior to Encounter[2] Health Maintenance  Topic Date Due   Hepatitis B Vaccine (1 of 3 - 19+ 3-dose series) Never done   HPV Vaccine (1 - 3-dose SCDM series) Never done   Flu Shot  04/22/2024   COVID-19 Vaccine (3 - 2025-26 season) 05/23/2024   DTaP/Tdap/Td vaccine (1 - Tdap) 10/20/2024*   Pneumococcal Vaccine (1 of 2 - PCV) 10/20/2024*   Pap with HPV screening  09/20/2029   Hepatitis C Screening  Completed   HIV Screening  Completed   Meningitis B Vaccine  Aged Out  *Topic was postponed. The date shown is not the original due date.    Objective:   Vitals:   10/04/24 1631  BP: (!) 148/83  Pulse: 82  Resp: 16  SpO2: 97%  Weight: 274 lb (124.3 kg)  Height: 5' 7 (1.702 m)   {Vitals History (Optional):23777}  Physical Exam Vitals reviewed.  Constitutional:      Appearance: Normal appearance. She is obese.     Comments: Severe morbid   HENT:     Head: Normocephalic.     Right Ear: Tympanic membrane, ear canal and external ear normal.     Left Ear: Tympanic membrane, ear canal and external ear normal.     Nose: Nose normal.      Mouth/Throat:     Mouth: Mucous membranes are moist.  Eyes:     Extraocular Movements: Extraocular movements intact.     Pupils: Pupils are equal, round, and reactive to light.  Cardiovascular:     Rate and Rhythm: Normal rate.  Pulmonary:     Effort: Pulmonary effort is normal.     Breath sounds: Normal breath sounds.  Abdominal:     General: Bowel sounds are normal.     Palpations: Abdomen is soft.  Musculoskeletal:        General: Normal range of motion.     Cervical back: Normal range of motion and neck supple.  Skin:    General: Skin is warm and dry.  Neurological:     Mental Status: She is alert and oriented to person, place, and time.  Psychiatric:        Mood and Affect: Mood normal.        Behavior: Behavior normal.        Thought Content: Thought content normal.     Assessment & Plan  Diagnoses and all orders for this visit:  Obesity (BMI 35.0-39.9 without comorbidity) -     metFORMIN  (GLUCOPHAGE -XR) 500 MG 24 hr tablet; Take 1 tablet (500  mg total) by mouth daily with breakfast.  Type 2 diabetes mellitus without complication, without long-term current use of insulin (HCC) -     metFORMIN  (GLUCOPHAGE -XR) 500 MG 24 hr tablet; Take 1 tablet (500 mg total) by mouth daily with breakfast.  Encounter for weight management     Patient have been counseled extensively about nutrition and exercise. Other issues discussed during this visit include: low cholesterol diet, weight control and daily exercise, foot care, annual eye examinations at Ophthalmology, importance of adherence with medications and regular follow-up. We also discussed long term complications of uncontrolled diabetes and hypertension.   No follow-ups on file.  The patient was given clear instructions to go to ER or return to medical center if symptoms don't improve, worsen or new problems develop. The patient verbalized understanding. The patient was told to call to get lab results if they haven't heard  anything in the next week.   This note has been created with Education officer, environmental. Any transcriptional errors are unintentional.   Melanie SHAUNNA Bohr, NP 10/04/2024, 5:11 PM    [1] No Known Allergies [2]  Current Outpatient Medications on File Prior to Visit  Medication Sig Dispense Refill   carvedilol  (COREG ) 12.5 MG tablet Take 1 tablet (12.5 mg total) by mouth 2 (two) times daily with a meal. 180 tablet 1   hydrochlorothiazide  (HYDRODIURIL ) 25 MG tablet Take 1 tablet (25 mg total) by mouth daily. 90 tablet 3   rosuvastatin  (CRESTOR ) 40 MG tablet Take 1 tablet (40 mg total) by mouth daily. 90 tablet 3   ibuprofen  (ADVIL ) 800 MG tablet Take 1 tablet (800 mg total) by mouth 3 (three) times daily with meals as needed for headache, moderate pain (pain score 4-6) or cramping. (Patient not taking: Reported on 10/04/2024) 30 tablet 1   misoprostol  (CYTOTEC ) 100 MCG tablet 1 tab per  vagina the night before and the morning of the procedure (Patient not taking: Reported on 10/04/2024) 2 tablet 0   tirzepatide  (ZEPBOUND ) 2.5 MG/0.5ML Pen Inject 2.5 mg into the skin once a week. (Patient not taking: Reported on 10/04/2024) 2 mL 1   No current facility-administered medications on file prior to visit.   "

## 2024-10-04 NOTE — Progress Notes (Unsigned)
 Wants referral for weight loss

## 2024-10-06 ENCOUNTER — Other Ambulatory Visit: Payer: Self-pay

## 2024-10-06 ENCOUNTER — Ambulatory Visit: Payer: Self-pay | Admitting: Obstetrics and Gynecology

## 2024-10-06 VITALS — BP 131/72 | HR 78 | Wt 270.0 lb

## 2024-10-06 DIAGNOSIS — Z30430 Encounter for insertion of intrauterine contraceptive device: Secondary | ICD-10-CM

## 2024-10-06 LAB — POCT PREGNANCY, URINE: Preg Test, Ur: NEGATIVE

## 2024-10-06 MED ORDER — IBUPROFEN 800 MG PO TABS
800.0000 mg | ORAL_TABLET | Freq: Once | ORAL | Status: AC
  Administered 2024-10-06: 800 mg via ORAL

## 2024-10-06 MED ORDER — LEVONORGESTREL 20 MCG/DAY IU IUD
1.0000 | INTRAUTERINE_SYSTEM | Freq: Once | INTRAUTERINE | Status: AC
  Administered 2024-10-06: 1 via INTRAUTERINE

## 2024-10-06 NOTE — Progress Notes (Signed)
" ° ° °  GYNECOLOGY OFFICE PROCEDURE NOTE  Melanie Hunt is a 39 y.o. G0P0000 here for Liletta  IUD insertion. No GYN concerns.  Last pap smear was on ASCUS pap with negative HPV.  IUD Insertion Procedure Note Patient identified, informed consent performed, consent signed.   Discussed risks of irregular bleeding, cramping, infection, malpositioning or misplacement of the IUD outside the uterus which may require further procedure such as laparoscopy. Also discussed >99% contraception efficacy, increased risk of ectopic pregnancy with failure of method.  Time out was performed.  Urine pregnancy test negative.  Pt was premedicated with cytotec  and valium  for discomfort and cervical softening.  Speculum placed in the vagina.  Cervix visualized.  Cleaned with Betadine x 2.  Grasped anteriorly with a single tooth tenaculum.  Uterus sounded to 9 cm.  Mirena  IUD placed per manufacturer's recommendations.  Strings trimmed to 3 cm. Tenaculum was removed, good hemostasis noted.  Patient tolerated procedure well.   Patient was given post-procedure instructions.  She was advised to have backup contraception for one week.  Patient was also asked to check IUD strings periodically and follow up in 4 weeks for IUD check.   Melanie Buddle, MD, FACOG Obstetrician & Gynecologist, [facility] for [facility], [facility]  "

## 2024-10-20 ENCOUNTER — Other Ambulatory Visit (INDEPENDENT_AMBULATORY_CARE_PROVIDER_SITE_OTHER): Payer: Self-pay | Admitting: Primary Care

## 2024-10-20 DIAGNOSIS — I10 Essential (primary) hypertension: Secondary | ICD-10-CM

## 2024-10-20 NOTE — Telephone Encounter (Signed)
 Requested Prescriptions  Pending Prescriptions Disp Refills   hydrochlorothiazide  (HYDRODIURIL ) 25 MG tablet [Pharmacy Med Name: HYDROCHLOROTHIAZIDE  25 MG TAB] 90 tablet 0    Sig: TAKE 1 TABLET (25 MG TOTAL) BY MOUTH DAILY.     Cardiovascular: Diuretics - Thiazide Passed - 10/20/2024  3:47 PM      Passed - Cr in normal range and within 180 days    Creatinine, Ser  Date Value Ref Range Status  05/27/2024 0.97 0.44 - 1.00 mg/dL Final         Passed - K in normal range and within 180 days    Potassium  Date Value Ref Range Status  05/27/2024 3.6 3.5 - 5.1 mmol/L Final         Passed - Na in normal range and within 180 days    Sodium  Date Value Ref Range Status  05/27/2024 140 135 - 145 mmol/L Final  04/06/2024 136 134 - 144 mmol/L Final         Passed - Last BP in normal range    BP Readings from Last 1 Encounters:  10/06/24 131/72         Passed - Valid encounter within last 6 months    Recent Outpatient Visits           2 weeks ago Obesity (BMI 35.0-39.9 without comorbidity)   [place] [facility] Celestia Rosaline SQUIBB, NP   5 months ago Chest pain, unspecified type   [place] [facility] Celestia Rosaline SQUIBB, NP   6 months ago IUD (intrauterine device) in place   [place] [facility] Celestia Rosaline SQUIBB, NP   1 year ago Essential hypertension   [place] [facility] Celestia Rosaline SQUIBB, NP   1 year ago Hospital discharge follow-up   [place] [facility] Dept Of [place]. [facility] [place], Rosaline SQUIBB, NP               rosuvastatin  (CRESTOR ) 40 MG tablet [Pharmacy Med Name: ROSUVASTATIN  CALCIUM  40 MG TAB] 90 tablet 0    Sig: TAKE 1 TABLET BY MOUTH EVERY DAY     Cardiovascular:  Antilipid - Statins 2 Failed - 10/20/2024  3:47 PM      Failed - Lipid Panel in normal range within the last 12 months    Cholesterol, Total  Date Value Ref Range Status  04/06/2024 124  100 - 199 mg/dL Final   LDL Chol Calc (NIH)  Date Value Ref Range Status  04/06/2024 44 0 - 99 mg/dL Final   HDL  Date Value Ref Range Status  04/06/2024 38 (L) >39 mg/dL Final   Triglycerides  Date Value Ref Range Status  04/06/2024 269 (H) 0 - 149 mg/dL Final         Passed - Cr in normal range and within 360 days    Creatinine, Ser  Date Value Ref Range Status  05/27/2024 0.97 0.44 - 1.00 mg/dL Final         Passed - Patient is not pregnant      Passed - Valid encounter within last 12 months    Recent Outpatient Visits           2 weeks ago Obesity (BMI 35.0-39.9 without comorbidity)   [place] [facility] Celestia Rosaline SQUIBB, NP   5 months ago Chest pain, unspecified type   [place] [facility] Celestia Rosaline SQUIBB, NP   6  months ago IUD (intrauterine device) in place   [place] [facility] Celestia Rosaline SQUIBB, NP   1 year ago Essential hypertension   [place] [facility] Celestia Rosaline SQUIBB, NP   1 year ago Hospital discharge follow-up   [place] [facility] Dept Of [place]. [facility] [place], Rosaline P, NP               carvedilol  (COREG ) 12.5 MG tablet [Pharmacy Med Name: CARVEDILOL  12.5 MG TABLET] 180 tablet 0    Sig: TAKE 1 TABLET (12.5MG  TOTAL) BY MOUTH TWICE A DAY WITH MEALS     Cardiovascular: Beta Blockers 3 Passed - 10/20/2024  3:47 PM      Passed - Cr in normal range and within 360 days    Creatinine, Ser  Date Value Ref Range Status  05/27/2024 0.97 0.44 - 1.00 mg/dL Final         Passed - AST in normal range and within 360 days    AST  Date Value Ref Range Status  05/27/2024 22 15 - 41 U/L Final         Passed - ALT in normal range and within 360 days    ALT  Date Value Ref Range Status  05/27/2024 30 0 - 44 U/L Final         Passed - Last BP in normal range    BP Readings from Last 1 Encounters:  10/06/24 131/72          Passed - Last Heart Rate in normal range    Pulse Readings from Last 1 Encounters:  10/06/24 78         Passed - Valid encounter within last 6 months    Recent Outpatient Visits           2 weeks ago Obesity (BMI 35.0-39.9 without comorbidity)   [place] [facility] Celestia Rosaline SQUIBB, NP   5 months ago Chest pain, unspecified type   [place] [facility] Celestia Rosaline SQUIBB, NP   6 months ago IUD (intrauterine device) in place   [place] [facility] Celestia Rosaline SQUIBB, NP   1 year ago Essential hypertension   [place] [facility] Celestia Rosaline SQUIBB, NP   1 year ago Hospital discharge follow-up    [facility] Dept Of [place]. [facility] Celestia Rosaline SQUIBB, [place]

## 2024-10-25 ENCOUNTER — Ambulatory Visit: Payer: Self-pay | Admitting: Primary Care

## 2024-10-28 ENCOUNTER — Other Ambulatory Visit (INDEPENDENT_AMBULATORY_CARE_PROVIDER_SITE_OTHER): Payer: Self-pay | Admitting: Primary Care

## 2024-10-28 DIAGNOSIS — E669 Obesity, unspecified: Secondary | ICD-10-CM

## 2024-10-28 DIAGNOSIS — E119 Type 2 diabetes mellitus without complications: Secondary | ICD-10-CM

## 2024-10-28 NOTE — Telephone Encounter (Signed)
 Will forward to provider

## 2024-11-14 ENCOUNTER — Ambulatory Visit: Payer: Self-pay | Admitting: Primary Care
